# Patient Record
Sex: Female | Born: 1980
Health system: Southern US, Community
[De-identification: ages and names within clinical notes are randomized; demographics above are authoritative.]

## PROBLEM LIST (undated history)

## (undated) DIAGNOSIS — K589 Irritable bowel syndrome without diarrhea: Secondary | ICD-10-CM

## (undated) HISTORY — PX: OTHER SURGICAL HISTORY: SHX169

## (undated) HISTORY — PX: ANKLE SURGERY: SHX546

---
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
## 2002-01-27 ENCOUNTER — Other Ambulatory Visit: Admission: RE | Admit: 2002-01-27 | Discharge: 2002-01-27 | Payer: Self-pay | Admitting: Obstetrics and Gynecology

## 2002-04-24 ENCOUNTER — Other Ambulatory Visit: Admission: RE | Admit: 2002-04-24 | Discharge: 2002-04-24 | Payer: Self-pay | Admitting: Obstetrics and Gynecology

## 2002-07-22 ENCOUNTER — Encounter: Payer: Self-pay | Admitting: Obstetrics and Gynecology

## 2002-07-22 ENCOUNTER — Inpatient Hospital Stay (HOSPITAL_COMMUNITY): Admission: AD | Admit: 2002-07-22 | Discharge: 2002-07-22 | Payer: Self-pay | Admitting: Obstetrics and Gynecology

## 2002-08-11 ENCOUNTER — Other Ambulatory Visit: Admission: RE | Admit: 2002-08-11 | Discharge: 2002-08-11 | Payer: Self-pay | Admitting: Obstetrics and Gynecology

## 2002-09-29 ENCOUNTER — Inpatient Hospital Stay (HOSPITAL_COMMUNITY): Admission: AD | Admit: 2002-09-29 | Discharge: 2002-09-29 | Payer: Self-pay | Admitting: Obstetrics and Gynecology

## 2002-10-04 ENCOUNTER — Inpatient Hospital Stay (HOSPITAL_COMMUNITY): Admission: AD | Admit: 2002-10-04 | Discharge: 2002-10-06 | Payer: Self-pay | Admitting: Obstetrics and Gynecology

## 2002-11-11 ENCOUNTER — Other Ambulatory Visit: Admission: RE | Admit: 2002-11-11 | Discharge: 2002-11-11 | Payer: Self-pay | Admitting: Obstetrics and Gynecology

## 2003-02-09 ENCOUNTER — Ambulatory Visit: Admission: RE | Admit: 2003-02-09 | Discharge: 2003-02-09 | Payer: Self-pay | Admitting: Gynecology

## 2003-05-05 ENCOUNTER — Ambulatory Visit (HOSPITAL_COMMUNITY): Admission: RE | Admit: 2003-05-05 | Discharge: 2003-05-05 | Payer: Self-pay | Admitting: Gynecology

## 2003-05-15 ENCOUNTER — Inpatient Hospital Stay (HOSPITAL_COMMUNITY): Admission: AD | Admit: 2003-05-15 | Discharge: 2003-05-15 | Payer: Self-pay | Admitting: Obstetrics and Gynecology

## 2003-06-02 ENCOUNTER — Ambulatory Visit: Admission: RE | Admit: 2003-06-02 | Discharge: 2003-06-02 | Payer: Self-pay | Admitting: Gynecology

## 2003-08-23 ENCOUNTER — Other Ambulatory Visit: Admission: RE | Admit: 2003-08-23 | Discharge: 2003-08-23 | Payer: Self-pay | Admitting: Obstetrics and Gynecology

## 2004-01-14 ENCOUNTER — Ambulatory Visit (HOSPITAL_COMMUNITY): Admission: RE | Admit: 2004-01-14 | Discharge: 2004-01-14 | Payer: Self-pay | Admitting: Obstetrics and Gynecology

## 2004-02-25 ENCOUNTER — Inpatient Hospital Stay (HOSPITAL_COMMUNITY): Admission: AD | Admit: 2004-02-25 | Discharge: 2004-02-27 | Payer: Self-pay | Admitting: Obstetrics and Gynecology

## 2004-02-28 ENCOUNTER — Encounter: Admission: RE | Admit: 2004-02-28 | Discharge: 2004-03-29 | Payer: Self-pay | Admitting: Obstetrics and Gynecology

## 2004-04-04 ENCOUNTER — Other Ambulatory Visit: Admission: RE | Admit: 2004-04-04 | Discharge: 2004-04-04 | Payer: Self-pay | Admitting: Obstetrics and Gynecology

## 2010-01-21 ENCOUNTER — Ambulatory Visit: Payer: Self-pay | Admitting: Family Medicine

## 2010-01-21 DIAGNOSIS — J40 Bronchitis, not specified as acute or chronic: Secondary | ICD-10-CM | POA: Insufficient documentation

## 2011-01-02 NOTE — Assessment & Plan Note (Signed)
Summary: sinus pressure, cough, SOB, chest congestion x 6dys   rm 2   Vital Signs:  Patient Profile:   30 Years Old Female CC:      Cold & URI symptoms Height:     62 inches Weight:      157 pounds O2 Sat:      99 % O2 treatment:    Room Air Temp:     99.0 degrees F oral Pulse rate:   96 / minute Pulse rhythm:   regular Resp:     16 per minute BP sitting:   126 / 80  (right arm) Cuff size:   regular  Vitals Entered By: Areta Haber CMA (January 21, 2010 1:54 PM)                  Current Allergies: No known allergies History of Present Illness History from: patient Chief Complaint: Cold & URI symptoms History of Present Illness: Otherwise healthy female. Comes c/o cough congestion and fever for over 1 week. Had the flu last week. Took tamiflu. Still persistent cough with yellow sputum. Also nasal congestion. Symptoms worse at night thinks she has heared wheezing. No chest pain or diffculty breathing but persistant cough at night. Has not checked her temperature but has chill at night time. Eating solids and drinking fluids but feels tired. Taking musinex.   REVIEW OF SYSTEMS Constitutional Symptoms       Complains of fever.     Denies chills, night sweats, weight loss, weight gain, and fatigue.  Eyes       Denies change in vision, eye pain, eye discharge, glasses, contact lenses, and eye surgery. Ear/Nose/Throat/Mouth       Complains of frequent runny nose, sinus problems, and hoarseness.      Denies hearing loss/aids, change in hearing, ear pain, ear discharge, dizziness, frequent nose bleeds, sore throat, and tooth pain or bleeding.      Comments: x 6dys Respiratory       Complains of dry cough and shortness of breath.      Denies productive cough, wheezing, asthma, bronchitis, and emphysema/COPD.  Cardiovascular       Denies murmurs, chest pain, and tires easily with exhertion.      Comments: chest congestion   Gastrointestinal       Denies stomach pain,  nausea/vomiting, diarrhea, constipation, blood in bowel movements, and indigestion. Genitourniary       Denies painful urination, kidney stones, and loss of urinary control. Neurological       Denies paralysis, seizures, and fainting/blackouts. Musculoskeletal       Complains of muscle pain and joint pain.      Denies joint stiffness, decreased range of motion, redness, swelling, muscle weakness, and gout.  Skin       Denies bruising, unusual mles/lumps or sores, and hair/skin or nail changes.  Psych       Denies mood changes, temper/anger issues, anxiety/stress, speech problems, depression, and sleep problems. Physical Exam General appearance: well developed, well nourished, no acute distress Eyes: conjunctivae and lids normal, no eye exudate. Ears: Clear fluid behind normal looking TM's bilat. Nasal: significant nasal congestion swolen turbinates and yellow rhinorhea. Oral/Pharynx: tongue normal, posterior pharynx without erythema or exudate Neck: neck supple,  trachea midline, no masses Chest/Lungs: Good effort. normal breast sound bilateral but sporadic ronchi bilaterally. No crackles or wheezing. Heart: regular rate and  rhythm, no murmur Abdomen: soft, non-tender without obvious organomegaly Skin: no obvious rashes. Assessment New Problems: BRONCHITIS (ICD-490)  Patient declined X-Ray as she needed to leave. I explained that she has bronchitis and although I dont have clinical findings suggesting pneumonia given the time of evolution of her symptoms there a change or early pneumonia. I decided to treat with 1g of Rocephin IM x1. And prescribed azithromycin for 5 days and albuterol inhaled. Also discussed symptomatic treatment. Reccomended follow up in 2 days if no improvement or earlier if worsening symptoms.  Plan New Medications/Changes: ROBAFEN AC 100-10 MG/5ML SYRP (GUAIFENESIN-CODEINE) 5 mls every 6 hours as needed for cough  #170mls x 0, 01/21/2010, Korvin Valentine Moreno-Coll   MD PROVENTIL HFA 108 (90 BASE) MCG/ACT AERS (ALBUTEROL SULFATE) 1-2 puff qid as needed for coughing spells or wheezing.  #1 x 0, 01/21/2010, Mar Zettler Moreno-Coll  MD AZITHROMYCIN 250 MG TABS (AZITHROMYCIN) 2 tabs by mouth today then 1 tab daily for 4 days.  #6 x 0, 01/21/2010, Larwence Tu Moreno-Coll  MD  New Orders: Rocephin  250mg  [Z3086] New Patient Level III [99203]  The patient and/or caregiver has been counseled thoroughly with regard to medications prescribed including dosage, schedule, interactions, rationale for use, and possible side effects and they verbalize understanding.  Diagnoses and expected course of recovery discussed and will return if not improved as expected or if the condition worsens. Patient and/or caregiver verbalized understanding.  Prescriptions: ROBAFEN AC 100-10 MG/5ML SYRP (GUAIFENESIN-CODEINE) 5 mls every 6 hours as needed for cough  #157mls x 0   Entered and Authorized by:   Sharin Grave  MD   Signed by:   Sharin Grave  MD on 01/21/2010   Method used:   Print then Give to Patient   RxID:   [id number] PROVENTIL HFA 108 (90 BASE) MCG/ACT AERS (ALBUTEROL SULFATE) 1-2 puff qid as needed for coughing spells or wheezing.  #1 x 0   Entered and Authorized by:   Sharin Grave  MD   Signed by:   Sharin Grave  MD on 01/21/2010   Method used:   Print then Give to Patient   RxID:   [id number] AZITHROMYCIN 250 MG TABS (AZITHROMYCIN) 2 tabs by mouth today then 1 tab daily for 4 days.  #6 x 0   Entered and Authorized by:   Sharin Grave  MD   Signed by:   Sharin Grave  MD on 01/21/2010   Method used:   Print then Give to Patient   RxID:   [id number]   Patient Instructions: 1)  Keep hydrated taking plenty of fluids. 2)  Use nasal saline spray at lest 3 times a day. 3)  Use decongestant and medication for cough as needed. 4)  Can also take clritin-D twise a day. 5)  Take tylenol every 6 hours or or motrin every 8 hours for fever or  pain. 6)  Start taking the prescribed antibiotic today. 7)  Your lungs sound well today and your oxigen exchange is also good. I think you have bronchitis but there is there is also the possibility of early stages of pneumonia. This is why I want you to be rechecked by your primary doctor or here in 2 days. Return sooner if increasing cough, fever, or new symptoms ( shortness of breath, chest pain) despite following treatment.

## 2011-04-20 NOTE — Consult Note (Signed)
Lauren Wade, Lauren Wade                           ACCOUNT NO.:  [id number]   MEDICAL RECORD NO.:  [id number]                   PATIENT TYPE:  OUT   LOCATION:  GYN                                  FACILITY:  [facility]   PHYSICIAN:  De Blanch, M.D.         DATE OF BIRTH:  [birth year]   DATE OF CONSULTATION:  DATE OF DISCHARGE:                                   CONSULTATION   HISTORY OF PRESENT ILLNESS:  The patient is a 30 year old white married  female seen in consultation at the request of Dr. Harold Hedge regarding  management of severe dysplasia of the cervix and vulva. The patient  apparently had an abnormal PAP smear in February of 2003 and was found at  that time to be pregnant. Colposcopy was performed. The patient also had  some lesions in her vulva during pregnancy which were biopsies that showed  vulvar intraepithelial neoplasia, grade III. Now postpartum, the patient has  undergone repeat PAP smear showing mild dysplasia but colposcopically  directed biopsy showed severe dysplasia and the cervical curettage is  negative. In addition, the patient has persistent vulvar lesions and had  multiple biopsies, all showing carcinoma in situ as well. The patient denies  any immunosuppression and is not a smoker. She is on oral contraceptives but  otherwise, is healthy. She has no past surgical history.   PHYSICAL EXAMINATION:  VITAL SIGNS: Weight 165 pounds. Height 5'2. Blood  pressure 118/70, pulse 92, respiratory rate 18.  GENERAL: A healthy young woman in no acute distress.  HEENT: Negative.  NECK: Supple without thyromegaly.  ABDOMEN: Soft, nontender. No masses, organomegaly, ascites, or hernias  noted.  PELVIC: EG BUS showed the number of raised flat warts. These are  approximately 5-6 mm in diameter on both sides of the vulva. There is some  hyperpigmented lesions as well. The vagina is normal. Cervix appears parous.  Uterus is anterior and normal shape, size, and  consistency.   PROCEDURE:  Colposcopic examination of the cervic, vulva, and vagina are  performed. The cervix shows a fairly large area of white epithelium  circumferentially. The squamocolumnar junction can be seen and there is no  involvement in the endocervical canal. The vagina appears normal. There are  multiple lesions on the vulva, some of which are hyperpigmented. Others are  hypopigmented.   IMPRESSION:  Severe dysplasia of the vulva and cervix.   PLAN:  Treatment options were discussed with the patient and her mother.  After exploring the options, it seems that the patient would be best treated  by using CO2 laser to vaporize the cervix as well as her vulvar lesions. I  indicated to the patient and her mother that there is approximately an 80%  success rate with this procedure but that prolonged follow-up with serial  PAP smears and colposcopies would be recommended. I contacted Dr. Harold Hedge to  discuss these recommendations and he would request  that we request with  scheduling laser vaporization. The patient is planning a trip to her home in  [place], [place] in the near future and we will therefore schedule her to  undergo outpatient laser vaporization on March 10, 2003.                                               De Blanch, M.D.    DC/MEDQ  D:  02/09/2003  T:  02/10/2003  Job:  [id number]   cc:   Guy Sandifer. Arleta Creek, M.D.  [address]  [place]  [place] [address]  Fax: [phone]   Telford Nab, R.N.

## 2011-04-20 NOTE — Op Note (Signed)
NAMEAYAHNA, SOLAZZO                           ACCOUNT NO.:  [id number]   MEDICAL RECORD NO.:  [id number]                   PATIENT TYPE:  AMB   LOCATION:  DAY                                  FACILITY:  [facility]    PHYSICIAN:  De Blanch, M.D.         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   PROCEDURES:  1. CO2 laser ablation of the cervix.  2. CO2 laser ablation of the vulva (extensive).   SURGEON:  De Blanch, M.D.   ANESTHESIA:  General LMA.   SURGICAL FINDINGS:  Colposcopic examination of the cervix was performed,  revealing white epithelium, which was relatively extensive over the  transformation zone.  The squamocolumnar junction was easily seen and the  colposcopy was deemed to be adequate.  The patient had multiple vulvar  lesions, which were raised and hyperpigmented, over nearly the entire vulva.  There was no involvement of the anus or vagina.   DESCRIPTION OF PROCEDURE:  The patient was brought to the operating room and  after satisfactory attainment of general anesthesia was placed in the  lithotomy position in candy cane stirrups.  A speculum with smoke evacuator  was positioned and the cervix exposed.  The cervix was washed with acetic  acid and a colposcopy was performed with the above-noted findings.  Using  the CO2 laser at 20 watts of power, the entire cervical transformation zone  was ablated to a depth of approximately 6 mm.  Additional ablation was  performed of the endocervical canal.  Some bleeding points were cauterized  using the Bovie electrocautery.   The speculum was removed and the vulva exposed.  Acetic acid was used on the  vulva to heighten recognition of the abnormal areas.  Using the CO2  laser  again at 20 watts of power but in a defocused mode, the vulvar lesions were  ablated to the depth of the second surgical plane.  The char was wiped away  using acetic acid, confirming the proper depth of ablation.   The vulva was washed.  The cervix was reinspected with a speculum and found  to be hemostatic.  Silvadene cream was applied to the vulva.  The patient  was awakened from anesthesia and taken to the recovery room in satisfactory  condition.  Sponge, needle, and instrument counts correct x2.                                                De Blanch, M.D.    DC/MEDQ  D:  05/05/2003  T:  05/05/2003  Job:  [id number]   cc:   Guy Sandifer. Arleta Creek, M.D.  [address]  [place]  [place] [address]  Fax: [phone]   Telford Nab, R.N.

## 2011-04-20 NOTE — Consult Note (Signed)
   NAME:  Lauren Wade, Lauren Wade                         ACCOUNT NO.:  [id number]   MEDICAL RECORD NO.:  [id number]                   PATIENT TYPE:  OUT   LOCATION:  GYN                                  FACILITY:  [facility]   PHYSICIAN:  De Blanch, M.D.         DATE OF BIRTH:  1981-02-05   DATE OF CONSULTATION:  06/02/2003  DATE OF DISCHARGE:                                   CONSULTATION   HISTORY OF PRESENT ILLNESS:  This 30 year old returns for postoperative  checkup, having had CO2 laser ablation of the cervix and vulva on May 05, 2003.  The vulvar ablation for carcinoma in situ was quite extensive.  The  patient actually had a very smooth recovery and at the present time feels  that she is nearly completely epithelialized and is having minimal  discomfort.  She is no longer using sitz baths or Silvadene cream.  She has  had one menstrual period which is currently ongoing.  She denies any other  vaginal bleeding or discharge.  She has no fever or chills, and her  functional status is excellent.  She seems to be quite happy with her  outcome.   PHYSICAL EXAMINATION:  VITAL SIGNS:  Weight 152 pounds.  Blood pressure  126/76.  GENERAL:  Healthy young woman in no acute distress.  PELVIC:  EGBUS shows the lasered areas are healing nicely with good  epithelialization.  There is still a small amount of granulation tissue on  the right upper vulva.  The remainder is basically completely  epithelialized.  The vagina has menstrual blood.  The cervix is status post  laser vaporization.  There is a bit of an ectropion which appears normal.   IMPRESSION:  Excellent recovery following laser vaporization for extensive  carcinoma in situ of the cervix and vulva.   I would like the patient to return in four weeks for another checkup, and at  that juncture as long as things are going well we will return her to the  care of her primary gynecologist, Dr. Henderson Cloud.                      De Blanch, M.D.    DC/MEDQ  D:  06/02/2003  T:  06/02/2003  Job:  [id number]   cc:   Guy Sandifer. Arleta Creek, M.D.  [address]  [place]  [place] [address]  Fax: [phone]   Telford Nab, R.N.  [phone] [address]  [place], [place] [address]

## 2012-12-19 ENCOUNTER — Emergency Department (INDEPENDENT_AMBULATORY_CARE_PROVIDER_SITE_OTHER): Payer: BC Managed Care – PPO

## 2012-12-19 ENCOUNTER — Encounter: Payer: Self-pay | Admitting: *Deleted

## 2012-12-19 ENCOUNTER — Emergency Department
Admission: EM | Admit: 2012-12-19 | Discharge: 2012-12-19 | Disposition: A | Discharge status: Home / Self Care | Payer: BC Managed Care – PPO | Source: Home / Self Care | Attending: Family Medicine | Admitting: Family Medicine

## 2012-12-19 ENCOUNTER — Other Ambulatory Visit: Payer: BC Managed Care – PPO

## 2012-12-19 DIAGNOSIS — K5900 Constipation, unspecified: Secondary | ICD-10-CM

## 2012-12-19 DIAGNOSIS — IMO0001 Reserved for inherently not codable concepts without codable children: Secondary | ICD-10-CM

## 2012-12-19 DIAGNOSIS — R1011 Right upper quadrant pain: Secondary | ICD-10-CM

## 2012-12-19 DIAGNOSIS — K589 Irritable bowel syndrome without diarrhea: Secondary | ICD-10-CM

## 2012-12-19 DIAGNOSIS — R1012 Left upper quadrant pain: Secondary | ICD-10-CM

## 2012-12-19 DIAGNOSIS — R109 Unspecified abdominal pain: Secondary | ICD-10-CM

## 2012-12-19 HISTORY — DX: Irritable bowel syndrome, unspecified: K58.9

## 2012-12-19 LAB — POCT CBC W AUTO DIFF (K'VILLE URGENT CARE)

## 2012-12-19 MED ORDER — PANTOPRAZOLE SODIUM 40 MG PO TBEC: 40.0000 mg | DELAYED_RELEASE_TABLET | Freq: Every day | ORAL | Status: DC

## 2012-12-19 NOTE — ED Notes (Signed)
Pt c/o RUQ abd pain x 1 1/2 wk, which has mostly resolved. She also c/o LUQ abd pain now with some nausea and increased burping. Denies fever. Denies vomiting and diarrhea.

## 2012-12-19 NOTE — ED Provider Notes (Signed)
History     CSN: [id number]  Arrival date & time 12/19/12  1059   First MD Initiated Contact with Patient 12/19/12 1108      Chief Complaint  Patient presents with  . Abdominal Pain    RUQ and LUQ abd pain   HPI Pt presents today with chief complaint of abdominal discomfort x 1 1/2 weeks.  Pt reports going to [place] 2-3 weeks ago.  Pt states that 1-2 days after coming home, she noticed intermittent RUQ pain.  Pain seems to be worse after eating.  Pt also had mild indigestion sxs with RUQ pain including belching and increased gas.  Pt with a baseline hx/o IBS-constipation predominant. Pt states that sxs have been fairly stable. No diarrhea.  Pt also reports prior hx/o reflux sxs that has not been formally treated.  Pt does report eating high amounts of fatty food while in [place] including buffalo wings and pizza. Discomfort has radiated to LUQ over past 3-4 days.  Discomfort is intermittent. Also sometimes associated with eating  No dysuria, diarrhea, nausea, vomiting.  No fevers, chills.  Pt describes sxs as more of a discomfort that is mild 5/10 in terms of intensity. No radiation.    Past Medical History  Diagnosis Date  . IBS (irritable bowel syndrome)     Past Surgical History  Procedure Date  . Removal of cervical ca     Family History  Problem Relation Age of Onset  . Hyperlipidemia Father     History  Substance Use Topics  . Smoking status: Former Games developer  . Smokeless tobacco: Not on file  . Alcohol Use: Yes    OB History    Grav Para Term Preterm Abortions TAB SAB Ect Mult Living                  Review of Systems  All other systems reviewed and are negative.    Allergies  Review of patient's allergies indicates no known allergies.  Home Medications  No current outpatient prescriptions on file.  BP 113/78  Pulse 70  Temp 98.3 F (36.8 C) (Oral)  Resp 16  Ht 5\' 2"  (1.575 m)  Wt 148 lb (67.132 kg)  BMI 27.07 kg/m2  SpO2 100%   LMP 12/15/2012  Physical Exam  Constitutional: She appears well-developed and well-nourished.  HENT:  Head: Normocephalic and atraumatic.  Right Ear: External ear normal.  Left Ear: External ear normal.  Eyes: Conjunctivae normal are normal. Pupils are equal, round, and reactive to light.  Neck: Normal range of motion. Neck supple.  Cardiovascular: Normal rate, regular rhythm and normal heart sounds.   Pulmonary/Chest: Effort normal and breath sounds normal.  Abdominal: Soft.       + mild RUQ TTP Mild LUQ TTP    ED Course  Procedures (including critical care time)   Labs Reviewed  CBC WITH DIFFERENTIAL  COMPREHENSIVE METABOLIC PANEL  LIPASE   Dg Abd 1 View  12/19/2012  *RADIOLOGY REPORT*  Clinical Data: Right-sided abdominal pain, now moving to the left side  ABDOMEN - 1 VIEW  Comparison: None.  Findings: A supine film of the abdomen shows a moderate amount of feces throughout the entire colon.  No bowel obstruction is seen. Some small bowel gas is noted in the left upper quadrant without distention.  No opaque calculi are noted.  The bones appear normal.  IMPRESSION:  Moderate amount of feces throughout the colon.  No bowel obstruction.   Original Report Authenticated  By: Dwyane Dee, M.D.    US Abdomen Complete  12/19/2012  *RADIOLOGY REPORT*  Clinical Data:  Abdominal pain.  COMPLETE ABDOMINAL ULTRASOUND  Comparison:  01/14/2004  Findings:  Gallbladder:  No gallstones, gallbladder wall thickening, or pericholecystic fluid.  Common bile duct:  Measures 0.2 cm.  Liver:  No focal lesion identified.  Within normal limits in parenchymal echogenicity.  IVC:  Appears normal.  Pancreas:  No focal abnormality seen.  Spleen:  Measures 5.9 cm.  Right Kidney:  Normal appearance of the right kidney.  Right kidney measures 9.3 cm length without hydronephrosis.  There is a 0.8 cm hypoechoic structure in the right kidney lower pole that is suggestive for a cyst.  Left Kidney:  Left kidney is poorly  visualized.  There may be mild fullness in the left renal pelvis which could represent an external pelvis and appears similar to the prior examination.  Abdominal aorta:  No aneurysm identified.  IMPRESSION: No acute findings.  Limited evaluation of the left kidney.  There may be fullness in left renal pelvis but this appears similar to the exam from 2005.   Original Report Authenticated By: Richarda Overlie, M.D.      1. Abdominal discomfort   2. Constipation   3. Reflux   4. IBS (irritable bowel syndrome)       MDM  Ultrasound negative for any bilary disease. There may be an element of biliary colic.  Noted moderate stool burden on KUB.  Will start on miralax.  Protonix for reflux coverage.  CBC reassuring.  CMET and lipase pending.  Discussed general diet and GI red flags.  Follow up as needed.     The patient and/or caregiver has been counseled thoroughly with regard to treatment plan and/or medications prescribed including dosage, schedule, interactions, rationale for use, and possible side effects and they verbalize understanding. Diagnoses and expected course of recovery discussed and will return if not improved as expected or if the condition worsens. Patient and/or caregiver verbalized understanding.             Doree Albee, MD 12/19/12 1309

## 2012-12-20 LAB — CBC WITH DIFFERENTIAL/PLATELET

## 2012-12-21 ENCOUNTER — Telehealth: Payer: Self-pay | Admitting: Emergency Medicine

## 2012-12-21 LAB — COMPREHENSIVE METABOLIC PANEL
ALT: 8 U/L (ref 0–35)
AST: 16 U/L (ref 0–37)
Alkaline Phosphatase: 53 U/L (ref 39–117)
BUN: 11 mg/dL (ref 6–23)
Creat: 0.9 mg/dL (ref 0.50–1.10)

## 2012-12-22 ENCOUNTER — Telehealth: Payer: Self-pay | Admitting: Emergency Medicine

## 2014-12-24 ENCOUNTER — Ambulatory Visit: Payer: BC Managed Care – PPO | Admitting: Physician Assistant

## 2015-01-07 ENCOUNTER — Encounter: Payer: Self-pay | Admitting: Physician Assistant

## 2015-01-07 ENCOUNTER — Ambulatory Visit (INDEPENDENT_AMBULATORY_CARE_PROVIDER_SITE_OTHER): Payer: BC Managed Care – PPO | Admitting: Physician Assistant

## 2015-01-07 VITALS — BP 122/73 | HR 86 | Ht 62.0 in | Wt 185.0 lb

## 2015-01-07 DIAGNOSIS — E6609 Other obesity due to excess calories: Secondary | ICD-10-CM | POA: Insufficient documentation

## 2015-01-07 DIAGNOSIS — F411 Generalized anxiety disorder: Secondary | ICD-10-CM | POA: Diagnosis not present

## 2015-01-07 DIAGNOSIS — M25511 Pain in right shoulder: Secondary | ICD-10-CM | POA: Diagnosis not present

## 2015-01-07 DIAGNOSIS — E669 Obesity, unspecified: Secondary | ICD-10-CM

## 2015-01-07 DIAGNOSIS — R635 Abnormal weight gain: Secondary | ICD-10-CM

## 2015-01-07 DIAGNOSIS — N879 Dysplasia of cervix uteri, unspecified: Secondary | ICD-10-CM | POA: Diagnosis not present

## 2015-01-07 DIAGNOSIS — Z6836 Body mass index (BMI) 36.0-36.9, adult: Secondary | ICD-10-CM

## 2015-01-07 MED ORDER — MELOXICAM 15 MG PO TABS: 15.0000 mg | ORAL_TABLET | Freq: Every day | ORAL | Status: DC

## 2015-01-07 MED ORDER — PHENTERMINE HCL 37.5 MG PO TABS: 37.5000 mg | ORAL_TABLET | Freq: Every day | ORAL | Status: DC

## 2015-01-07 NOTE — Patient Instructions (Addendum)
[place]/Viibryd- weight neutral anxiety.   Impingement Syndrome, Rotator Cuff, Bursitis with Rehab Impingement syndrome is a condition that involves inflammation of the tendons of the rotator cuff and the subacromial bursa, that causes pain in the shoulder. The rotator cuff consists of four tendons and muscles that control much of the shoulder and upper arm function. The subacromial bursa is a fluid filled sac that helps reduce friction between the rotator cuff and one of the bones of the shoulder (acromion). Impingement syndrome is usually an overuse injury that causes swelling of the bursa (bursitis), swelling of the tendon (tendonitis), and/or a tear of the tendon (strain). Strains are classified into three categories. Grade 1 strains cause pain, but the tendon is not lengthened. Grade 2 strains include a lengthened ligament, due to the ligament being stretched or partially ruptured. With grade 2 strains there is still function, although the function may be decreased. Grade 3 strains include a complete tear of the tendon or muscle, and function is usually impaired. SYMPTOMS   Pain around the shoulder, often at the outer portion of the upper arm.  Pain that gets worse with shoulder function, especially when reaching overhead or lifting.  Sometimes, aching when not using the arm.  Pain that wakes you up at night.  Sometimes, tenderness, swelling, warmth, or redness over the affected area.  Loss of strength.  Limited motion of the shoulder, especially reaching behind the back (to the back pocket or to unhook bra) or across your body.  Crackling sound (crepitation) when moving the arm.  Biceps tendon pain and inflammation (in the front of the shoulder). Worse when bending the elbow or lifting. CAUSES  Impingement syndrome is often an overuse injury, in which chronic (repetitive) motions cause the tendons or bursa to become inflamed. A strain occurs when a force is paced on the tendon or  muscle that is greater than it can withstand. Common mechanisms of injury include: Stress from sudden increase in duration, frequency, or intensity of training.  Direct hit (trauma) to the shoulder.  Aging, erosion of the tendon with normal use.  Bony bump on shoulder (acromial spur). RISK INCREASES WITH:  Contact sports (football, wrestling, boxing).  Throwing sports (baseball, tennis, volleyball).  Weightlifting and bodybuilding.  Heavy labor.  Previous injury to the rotator cuff, including impingement.  Poor shoulder strength and flexibility.  Failure to warm up properly before activity.  Inadequate protective equipment.  Old age.  Bony bump on shoulder (acromial spur). PREVENTION   Warm up and stretch properly before activity.  Allow for adequate recovery between workouts.  Maintain physical fitness:  Strength, flexibility, and endurance.  Cardiovascular fitness.  Learn and use proper exercise technique. PROGNOSIS  If treated properly, impingement syndrome usually goes away within 6 weeks. Sometimes surgery is required.  RELATED COMPLICATIONS   Longer healing time if not properly treated, or if not given enough time to heal.  Recurring symptoms, that result in a chronic condition.  Shoulder stiffness, frozen shoulder, or loss of motion.  Rotator cuff tendon tear.  Recurring symptoms, especially if activity is resumed too soon, with overuse, with a direct blow, or when using poor technique. TREATMENT  Treatment first involves the use of ice and medicine, to reduce pain and inflammation. The use of strengthening and stretching exercises may help reduce pain with activity. These exercises may be performed at home or with a therapist. If non-surgical treatment is unsuccessful after more than 6 months, surgery may be advised. After surgery and rehabilitation, activity  is usually possible in 3 months.  MEDICATION  If pain medicine is needed, nonsteroidal  anti-inflammatory medicines (aspirin and ibuprofen), or other minor pain relievers (acetaminophen), are often advised.  Do not take pain medicine for 7 days before surgery.  Prescription pain relievers may be given, if your caregiver thinks they are needed. Use only as directed and only as much as you need.  Corticosteroid injections may be given by your caregiver. These injections should be reserved for the most serious cases, because they may only be given a certain number of times. HEAT AND COLD  Cold treatment (icing) should be applied for 10 to 15 minutes every 2 to 3 hours for inflammation and pain, and immediately after activity that aggravates your symptoms. Use ice packs or an ice massage.  Heat treatment may be used before performing stretching and strengthening activities prescribed by your caregiver, physical therapist, or athletic trainer. Use a heat pack or a warm water soak. SEEK MEDICAL CARE IF:   Symptoms get worse or do not improve in 4 to 6 weeks, despite treatment.  New, unexplained symptoms develop. (Drugs used in treatment may produce side effects.) EXERCISES  RANGE OF MOTION (ROM) AND STRETCHING EXERCISES - Impingement Syndrome (Rotator Cuff  Tendinitis, Bursitis) These exercises may help you when beginning to rehabilitate your injury. Your symptoms may go away with or without further involvement from your physician, physical therapist or athletic trainer. While completing these exercises, remember:   Restoring tissue flexibility helps normal motion to return to the joints. This allows healthier, less painful movement and activity.  An effective stretch should be held for at least 30 seconds.  A stretch should never be painful. You should only feel a gentle lengthening or release in the stretched tissue. STRETCH - Flexion, Standing  Stand with good posture. With an underhand grip on your right / left hand, and an overhand grip on the opposite hand, grasp a  broomstick or cane so that your hands are a little more than shoulder width apart.  Keeping your right / left elbow straight and shoulder muscles relaxed, push the stick with your opposite hand, to raise your right / left arm in front of your body and then overhead. Raise your arm until you feel a stretch in your right / left shoulder, but before you have increased shoulder pain.  Try to avoid shrugging your right / left shoulder as your arm rises, by keeping your shoulder blade tucked down and toward your mid-back spine. Hold for __________ seconds.  Slowly return to the starting position. Repeat __________ times. Complete this exercise __________ times per day. STRETCH - Abduction, Supine  Lie on your back. With an underhand grip on your right / left hand and an overhand grip on the opposite hand, grasp a broomstick or cane so that your hands are a little more than shoulder width apart.  Keeping your right / left elbow straight and your shoulder muscles relaxed, push the stick with your opposite hand, to raise your right / left arm out to the side of your body and then overhead. Raise your arm until you feel a stretch in your right / left shoulder, but before you have increased shoulder pain.  Try to avoid shrugging your right / left shoulder as your arm rises, by keeping your shoulder blade tucked down and toward your mid-back spine. Hold for __________ seconds.  Slowly return to the starting position. Repeat __________ times. Complete this exercise __________ times per day. ROM -  Flexion, Active-Assisted  Lie on your back. You may bend your knees for comfort.  Grasp a broomstick or cane so your hands are about shoulder width apart. Your right / left hand should grip the end of the stick, so that your hand is positioned "thumbs-up," as if you were about to shake hands.  Using your healthy arm to lead, raise your right / left arm overhead, until you feel a gentle stretch in your shoulder.  Hold for __________ seconds.  Use the stick to assist in returning your right / left arm to its starting position. Repeat __________ times. Complete this exercise __________ times per day.  ROM - Internal Rotation, Supine   Lie on your back on a firm surface. Place your right / left elbow about 60 degrees away from your side. Elevate your elbow with a folded towel, so that the elbow and shoulder are the same height.  Using a broomstick or cane and your strong arm, pull your right / left hand toward your body until you feel a gentle stretch, but no increase in your shoulder pain. Keep your shoulder and elbow in place throughout the exercise.  Hold for __________ seconds. Slowly return to the starting position. Repeat __________ times. Complete this exercise __________ times per day. STRETCH - Internal Rotation  Place your right / left hand behind your back, palm up.  Throw a towel or belt over your opposite shoulder. Grasp the towel with your right / left hand.  While keeping an upright posture, gently pull up on the towel, until you feel a stretch in the front of your right / left shoulder.  Avoid shrugging your right / left shoulder as your arm rises, by keeping your shoulder blade tucked down and toward your mid-back spine.  Hold for __________ seconds. Release the stretch, by lowering your healthy hand. Repeat __________ times. Complete this exercise __________ times per day. ROM - Internal Rotation   Using an underhand grip, grasp a stick behind your back with both hands.  While standing upright with good posture, slide the stick up your back until you feel a mild stretch in the front of your shoulder.  Hold for __________ seconds. Slowly return to your starting position. Repeat __________ times. Complete this exercise __________ times per day.  STRETCH - Posterior Shoulder Capsule   Stand or sit with good posture. Grasp your right / left elbow and draw it across your chest,  keeping it at the same height as your shoulder.  Pull your elbow, so your upper arm comes in closer to your chest. Pull until you feel a gentle stretch in the back of your shoulder.  Hold for __________ seconds. Repeat __________ times. Complete this exercise __________ times per day. STRENGTHENING EXERCISES - Impingement Syndrome (Rotator Cuff Tendinitis, Bursitis) These exercises may help you when beginning to rehabilitate your injury. They may resolve your symptoms with or without further involvement from your physician, physical therapist or athletic trainer. While completing these exercises, remember:  Muscles can gain both the endurance and the strength needed for everyday activities through controlled exercises.  Complete these exercises as instructed by your physician, physical therapist or athletic trainer. Increase the resistance and repetitions only as guided.  You may experience muscle soreness or fatigue, but the pain or discomfort you are trying to eliminate should never worsen during these exercises. If this pain does get worse, stop and make sure you are following the directions exactly. If the pain is still present after adjustments,  discontinue the exercise until you can discuss the trouble with your clinician.  During your recovery, avoid activity or exercises which involve actions that place your injured hand or elbow above your head or behind your back or head. These positions stress the tissues which you are trying to heal. STRENGTH - Scapular Depression and Adduction   With good posture, sit on a firm chair. Support your arms in front of you, with pillows, arm rests, or on a table top. Have your elbows in line with the sides of your body.  Gently draw your shoulder blades down and toward your mid-back spine. Gradually increase the tension, without tensing the muscles along the top of your shoulders and the back of your neck.  Hold for __________ seconds. Slowly release  the tension and relax your muscles completely before starting the next repetition.  After you have practiced this exercise, remove the arm support and complete the exercise in standing as well as sitting position. Repeat __________ times. Complete this exercise __________ times per day.  STRENGTH - Shoulder Abductors, Isometric  With good posture, stand or sit about 4-6 inches from a wall, with your right / left side facing the wall.  Bend your right / left elbow. Gently press your right / left elbow into the wall. Increase the pressure gradually, until you are pressing as hard as you can, without shrugging your shoulder or increasing any shoulder discomfort.  Hold for __________ seconds.  Release the tension slowly. Relax your shoulder muscles completely before you begin the next repetition. Repeat __________ times. Complete this exercise __________ times per day.  STRENGTH - External Rotators, Isometric  Keep your right / left elbow at your side and bend it 90 degrees.  Step into a door frame so that the outside of your right / left wrist can press against the door frame without your upper arm leaving your side.  Gently press your right / left wrist into the door frame, as if you were trying to swing the back of your hand away from your stomach. Gradually increase the tension, until you are pressing as hard as you can, without shrugging your shoulder or increasing any shoulder discomfort.  Hold for __________ seconds.  Release the tension slowly. Relax your shoulder muscles completely before you begin the next repetition. Repeat __________ times. Complete this exercise __________ times per day.  STRENGTH - Supraspinatus   Stand or sit with good posture. Grasp a __________ weight, or an exercise band or tubing, so that your hand is "thumbs-up," like you are shaking hands.  Slowly lift your right / left arm in a "V" away from your thigh, diagonally into the space between your side and  straight ahead. Lift your hand to shoulder height or as far as you can, without increasing any shoulder pain. At first, many people do not lift their hands above shoulder height.  Avoid shrugging your right / left shoulder as your arm rises, by keeping your shoulder blade tucked down and toward your mid-back spine.  Hold for __________ seconds. Control the descent of your hand, as you slowly return to your starting position. Repeat __________ times. Complete this exercise __________ times per day.  STRENGTH - External Rotators  Secure a rubber exercise band or tubing to a fixed object (table, pole) so that it is at the same height as your right / left elbow when you are standing or sitting on a firm surface.  Stand or sit so that the secured exercise band  is at your uninjured side.  Bend your right / left elbow 90 degrees. Place a folded towel or small pillow under your right / left arm, so that your elbow is a few inches away from your side.  Keeping the tension on the exercise band, pull it away from your body, as if pivoting on your elbow. Be sure to keep your body steady, so that the movement is coming only from your rotating shoulder.  Hold for __________ seconds. Release the tension in a controlled manner, as you return to the starting position. Repeat __________ times. Complete this exercise __________ times per day.  STRENGTH - Internal Rotators   Secure a rubber exercise band or tubing to a fixed object (table, pole) so that it is at the same height as your right / left elbow when you are standing or sitting on a firm surface.  Stand or sit so that the secured exercise band is at your right / left side.  Bend your elbow 90 degrees. Place a folded towel or small pillow under your right / left arm so that your elbow is a few inches away from your side.  Keeping the tension on the exercise band, pull it across your body, toward your stomach. Be sure to keep your body steady, so that  the movement is coming only from your rotating shoulder.  Hold for __________ seconds. Release the tension in a controlled manner, as you return to the starting position. Repeat __________ times. Complete this exercise __________ times per day.  STRENGTH - Scapular Protractors, Standing   Stand arms length away from a wall. Place your hands on the wall, keeping your elbows straight.  Begin by dropping your shoulder blades down and toward your mid-back spine.  To strengthen your protractors, keep your shoulder blades down, but slide them forward on your rib cage. It will feel as if you are lifting the back of your rib cage away from the wall. This is a subtle motion and can be challenging to complete. Ask your caregiver for further instruction, if you are not sure you are doing the exercise correctly.  Hold for __________ seconds. Slowly return to the starting position, resting the muscles completely before starting the next repetition. Repeat __________ times. Complete this exercise __________ times per day. STRENGTH - Scapular Protractors, Supine  Lie on your back on a firm surface. Extend your right / left arm straight into the air while holding a __________ weight in your hand.  Keeping your head and back in place, lift your shoulder off the floor.  Hold for __________ seconds. Slowly return to the starting position, and allow your muscles to relax completely before starting the next repetition. Repeat __________ times. Complete this exercise __________ times per day. STRENGTH - Scapular Protractors, Quadruped  Get onto your hands and knees, with your shoulders directly over your hands (or as close as you can be, comfortably).  Keeping your elbows locked, lift the back of your rib cage up into your shoulder blades, so your mid-back rounds out. Keep your neck muscles relaxed.  Hold this position for __________ seconds. Slowly return to the starting position and allow your muscles to  relax completely before starting the next repetition. Repeat __________ times. Complete this exercise __________ times per day.  STRENGTH - Scapular Retractors  Secure a rubber exercise band or tubing to a fixed object (table, pole), so that it is at the height of your shoulders when you are either standing, or sitting on  a firm armless chair.  With a palm down grip, grasp an end of the band in each hand. Straighten your elbows and lift your hands straight in front of you, at shoulder height. Step back, away from the secured end of the band, until it becomes tense.  Squeezing your shoulder blades together, draw your elbows back toward your sides, as you bend them. Keep your upper arms lifted away from your body throughout the exercise.  Hold for __________ seconds. Slowly ease the tension on the band, as you reverse the directions and return to the starting position. Repeat __________ times. Complete this exercise __________ times per day. STRENGTH - Shoulder Extensors   Secure a rubber exercise band or tubing to a fixed object (table, pole) so that it is at the height of your shoulders when you are either standing, or sitting on a firm armless chair.  With a thumbs-up grip, grasp an end of the band in each hand. Straighten your elbows and lift your hands straight in front of you, at shoulder height. Step back, away from the secured end of the band, until it becomes tense.  Squeezing your shoulder blades together, pull your hands down to the sides of your thighs. Do not allow your hands to go behind you.  Hold for __________ seconds. Slowly ease the tension on the band, as you reverse the directions and return to the starting position. Repeat __________ times. Complete this exercise __________ times per day.  STRENGTH - Scapular Retractors and External Rotators   Secure a rubber exercise band or tubing to a fixed object (table, pole) so that it is at the height as your shoulders, when you are  either standing, or sitting on a firm armless chair.  With a palm down grip, grasp an end of the band in each hand. Bend your elbows 90 degrees and lift your elbows to shoulder height, at your sides. Step back, away from the secured end of the band, until it becomes tense.  Squeezing your shoulder blades together, rotate your shoulders so that your upper arms and elbows remain stationary, but your fists travel upward to head height.  Hold for __________ seconds. Slowly ease the tension on the band, as you reverse the directions and return to the starting position. Repeat __________ times. Complete this exercise __________ times per day.  STRENGTH - Scapular Retractors and External Rotators, Rowing   Secure a rubber exercise band or tubing to a fixed object (table, pole) so that it is at the height of your shoulders, when you are either standing, or sitting on a firm armless chair.  With a palm down grip, grasp an end of the band in each hand. Straighten your elbows and lift your hands straight in front of you, at shoulder height. Step back, away from the secured end of the band, until it becomes tense.  Step 1: Squeeze your shoulder blades together. Bending your elbows, draw your hands to your chest, as if you are rowing a boat. At the end of this motion, your hands and elbow should be at shoulder height and your elbows should be out to your sides.  Step 2: Rotate your shoulders, to raise your hands above your head. Your forearms should be vertical and your upper arms should be horizontal.  Hold for __________ seconds. Slowly ease the tension on the band, as you reverse the directions and return to the starting position. Repeat __________ times. Complete this exercise __________ times per day.  STRENGTH -  Scapular Depressors  Find a sturdy chair without wheels, such as a dining room chair.  Keeping your feet on the floor, and your hands on the chair arms, lift your bottom up from the seat,  and lock your elbows.  Keeping your elbows straight, allow gravity to pull your body weight down. Your shoulders will rise toward your ears.  Raise your body against gravity by drawing your shoulder blades down your back, shortening the distance between your shoulders and ears. Although your feet should always maintain contact with the floor, your feet should progressively support less body weight, as you get stronger.  Hold for __________ seconds. In a controlled and slow manner, lower your body weight to begin the next repetition. Repeat __________ times. Complete this exercise __________ times per day.  Document Released: 11/19/2005 Document Revised: 02/11/2012 Document Reviewed: 03/03/2009 [facility] Patient Information 2015 [place], [place]. This information is not intended to replace advice given to you by your health care provider. Make sure you discuss any questions you have with your health care provider.

## 2015-01-07 NOTE — Progress Notes (Signed)
   Subjective:    Patient ID: Lauren Wade, female    DOB: 1981-08-02, 34 y.o.   MRN: [id number]  HPI  Pt is a 34 yo female who presents to the clinic to establish care.   .. Active Ambulatory Problems    Diagnosis Date Noted  . BRONCHITIS 01/21/2010  . GAD (generalized anxiety disorder) 01/07/2015  . Cervical dysplasia 01/07/2015  . Abnormal weight gain 01/07/2015  . Obese 01/07/2015  . Right shoulder pain 01/11/2015   Resolved Ambulatory Problems    Diagnosis Date Noted  . No Resolved Ambulatory Problems   Past Medical History  Diagnosis Date  . IBS (irritable bowel syndrome)    .Marland Kitchen. Family History  Problem Relation Age of Onset  . Hyperlipidemia Father   . Depression Mother   . Depression Paternal Aunt    .Marland Kitchen. History   Social History  . Marital Status: Married    Spouse Name: N/A    Number of Children: N/A  . Years of Education: N/A   Occupational History  . Not on file.   Social History Main Topics  . Smoking status: Former Games developermoker  . Smokeless tobacco: Not on file  . Alcohol Use: Yes  . Drug Use: No  . Sexual Activity: Yes   Other Topics Concern  . Not on file   Social History Narrative   Pt is recently started lexapro and counseling. Anxiety seems to be worsening with work load and family life.   Pt is ready to lose weight. She gained 80+ pounds between her pregnancies. She is walking but needs help.   She has also had right shoulder pain for 3 years. Started when she did crossfit. Pain is annoying more than intense. Upper back around shoulder blade is where most of her pain is. Worse since she had son. ROM is normal. Helps when husband massages and digs into with his elbow. Ibuprofen knocks off some of discomfort. No known injury.     Review of Systems  All other systems reviewed and are negative.      Objective:   Physical Exam  Constitutional: She is oriented to person, place, and time. She appears well-developed and well-nourished.   HENT:  Head: Normocephalic and atraumatic.  Cardiovascular: Normal rate, regular rhythm and normal heart sounds.   Pulmonary/Chest: Effort normal and breath sounds normal.  Musculoskeletal:  Full ROM of right shoulder.  Negative hawkins, empty can Strength 5/5.  No pain over bony landmarks of right shoulder.  Tenderness over muscles around upper scapula that connect to clavicle(supraspinatus).   Neurological: She is alert and oriented to person, place, and time.  Skin: Skin is dry.  Psychiatric: She has a normal mood and affect. Her behavior is normal.          Assessment & Plan:  GAD- managed by psych and counseling through [facility]. Just started lexapro.   Obese/abnormal weight gain- discussed weight loss options. Opted to start phentermine. Discussed diet and exercise plan. Reviewed side effects. Follow up in one month for weight and vital recheck.   Right shoulder pain- seems like posterior rotator cuff tendonitis. Given stretches and mobic. Follow up if not improving. Offered flexeril pt declined.

## 2015-01-11 DIAGNOSIS — M25511 Pain in right shoulder: Secondary | ICD-10-CM | POA: Insufficient documentation

## 2015-02-16 ENCOUNTER — Encounter: Payer: Self-pay | Admitting: Physician Assistant

## 2015-02-16 ENCOUNTER — Ambulatory Visit (INDEPENDENT_AMBULATORY_CARE_PROVIDER_SITE_OTHER): Payer: BC Managed Care – PPO | Admitting: Physician Assistant

## 2015-02-16 VITALS — BP 128/84 | HR 95 | Ht 62.0 in | Wt 172.0 lb

## 2015-02-16 DIAGNOSIS — E669 Obesity, unspecified: Secondary | ICD-10-CM | POA: Diagnosis not present

## 2015-02-16 DIAGNOSIS — R635 Abnormal weight gain: Secondary | ICD-10-CM | POA: Diagnosis not present

## 2015-02-16 MED ORDER — PHENTERMINE HCL 37.5 MG PO TABS: 37.5000 mg | ORAL_TABLET | Freq: Every day | ORAL | Status: DC

## 2015-02-16 NOTE — Progress Notes (Signed)
Subjective:    Patient ID: Lauren Wade, female    DOB: 06/16/1981, 34 y.o.   MRN: [id number]  HPI Patient is a 34-year-old female who presents to the clinic to follow-up on one month of phentermine. She has made significant diet changes. She is starting to count her calories. She met she has much more energy. She has not started an exercise routine at this time. She is down 13 pounds in one month. She has no complaints or side effects. The first couple day she did feel little more anxious but take half tab until she began to tolerate better.   Review of Systems  All other systems reviewed and are negative.      Objective:   Physical Exam  Constitutional: She is oriented to person, place, and time. She appears well-developed and well-nourished.  HENT:  Head: Normocephalic and atraumatic.  Cardiovascular: Normal rate, regular rhythm and normal heart sounds.   Pulmonary/Chest: Effort normal and breath sounds normal.  Neurological: She is alert and oriented to person, place, and time.  Skin: Skin is dry.  Psychiatric: She has a normal mood and affect. Her behavior is normal.          Assessment & Plan:  Obesity/abnormal weight gain-patient is doing great on phentermine. Will refill for 2 months. She can follow-up with nurse visit in 2 months. Encouraged her to incorporate exercise into her diet changes. 

## 2015-02-18 ENCOUNTER — Ambulatory Visit: Payer: BC Managed Care – PPO | Admitting: Physician Assistant

## 2015-04-26 ENCOUNTER — Ambulatory Visit (INDEPENDENT_AMBULATORY_CARE_PROVIDER_SITE_OTHER): Payer: BC Managed Care – PPO | Admitting: Family Medicine

## 2015-04-26 VITALS — BP 129/85 | HR 95 | Wt 161.0 lb

## 2015-04-26 DIAGNOSIS — Z6829 Body mass index (BMI) 29.0-29.9, adult: Secondary | ICD-10-CM

## 2015-04-26 DIAGNOSIS — R635 Abnormal weight gain: Secondary | ICD-10-CM

## 2015-04-26 MED ORDER — PHENTERMINE HCL 37.5 MG PO TABS: 37.5000 mg | ORAL_TABLET | Freq: Every day | ORAL | Status: DC

## 2015-04-26 NOTE — Progress Notes (Signed)
   Subjective:    Patient ID: Lauren SinclairAmber M Umali, female    DOB: 1981/10/07, 34 y.o.   MRN: [id number]  HPI  Patient is here for blood pressure and weight check. Denies any trouble sleeping, palpitations, or any other medication problems. Patient states she has been making some dietary changes but has not yet began working out.    Review of Systems     Objective:   Physical Exam        Assessment & Plan:  Patient has lost weight. A refill for Phentermine will be sent to patient preferred pharmacy. Patient advised to schedule a four week nurse visit and keep her upcoming appointment with her PCP. Verbalized understanding, no further questions.   Agree with above. Down 11 lbs.   Nani Gasseratherine Metheney, MD

## 2015-05-04 ENCOUNTER — Ambulatory Visit (INDEPENDENT_AMBULATORY_CARE_PROVIDER_SITE_OTHER): Payer: BC Managed Care – PPO | Admitting: Family Medicine

## 2015-05-04 ENCOUNTER — Encounter: Payer: Self-pay | Admitting: Family Medicine

## 2015-05-04 VITALS — BP 125/79 | HR 96 | Temp 97.7°F | Wt 160.0 lb

## 2015-05-04 DIAGNOSIS — K921 Melena: Secondary | ICD-10-CM

## 2015-05-04 DIAGNOSIS — R1011 Right upper quadrant pain: Secondary | ICD-10-CM | POA: Diagnosis not present

## 2015-05-04 DIAGNOSIS — K589 Irritable bowel syndrome without diarrhea: Secondary | ICD-10-CM

## 2015-05-04 MED ORDER — LUBIPROSTONE 8 MCG PO CAPS: 8.0000 ug | ORAL_CAPSULE | Freq: Two times a day (BID) | ORAL | Status: DC

## 2015-05-04 NOTE — Progress Notes (Signed)
CC: Lauren Wade is a 34 y.o. female is here for Blood In Stools and Abdominal Pain   Subjective: HPI:  Right upper quadrant pain that has been present for a matter of years. Over the past 1 or 2 months it's been worsening. It's producible occurring after every meal but can also occur at random times throughout the day. It radiates into the right shoulder. It's accompanied by nausea when pain is severe. She's never had vomiting or diarrhea with this. No unintentional weight loss. She had a sonogram over a year ago that showed a "mushy" gallbladder. Symptoms were improved after she reduced fat in her diet.  She also complains of a small amount of blood in her stool with every bowel movement. It's been accompanied by constipation where she feels like she has to go defecate but will either be unable to pass a bowel movement for has just a small amount of a normal caliber piece of feces. She denies melanoma or clots in her stool. She also has a white string that seems to be mixed within the bowel movements. She has some left lower quadrant and left upper quadrant pain prior to a bowel movement that's alleviated by the bowel movement. She denies any shortness of breath, rapid heartbeat, chest pain, nor limb claudication. Denies any other bleeding problems. Carries a remote diagnosis of IBS which has caused identical pain and bowel movement symptoms but in the past was improved with increasing fiber which she started to try.   Review Of Systems Outlined In HPI  Past Medical History  Diagnosis Date  . IBS (irritable bowel syndrome)     Past Surgical History  Procedure Laterality Date  . Removal of cervical ca     Family History  Problem Relation Age of Onset  . Hyperlipidemia Father   . Depression Mother   . Depression Paternal Aunt     History   Social History  . Marital Status: Married    Spouse Name: N/A  . Number of Children: N/A  . Years of Education: N/A   Occupational History   . Not on file.   Social History Main Topics  . Smoking status: Former Games developer  . Smokeless tobacco: Not on file  . Alcohol Use: Yes  . Drug Use: No  . Sexual Activity: Yes   Other Topics Concern  . Not on file   Social History Narrative     Objective: BP 125/79 mmHg  Pulse 96  Temp(Src) 97.7 F (36.5 C) (Oral)  Wt 160 lb (72.576 kg)  Vital signs reviewed. General: Alert and Oriented, No Acute Distress HEENT: Pupils equal, round, reactive to light. Conjunctivae clear.  External ears unremarkable.  Moist mucous membranes. Lungs: Clear and comfortable work of breathing, speaking in full sentences without accessory muscle use. Cardiac: Regular rate and rhythm.  Neuro: CN II-XII grossly intact, gait normal. Abdomen: Mild obesity soft with right upper quadrant pain Extremities: No peripheral edema.  Strong peripheral pulses.  Mental Status: No depression, anxiety, nor agitation. Logical though process. Skin: Warm and dry.  Assessment & Plan: Lauren Wade was seen today for blood in stools and abdominal pain.  Diagnoses and all orders for this visit:  RUQ pain Orders: -     Ova and parasite examination -     Gamma GT -     COMPLETE METABOLIC PANEL WITH GFR  Blood in stool Orders: -     Ova and parasite examination -     COMPLETE METABOLIC PANEL  WITH GFR  Irritable bowel syndrome (IBS) Orders: -     lubiprostone (AMITIZA) 8 MCG capsule; Take 1 capsule (8 mcg total) by mouth 2 (two) times daily with a meal.   Right upper quadrant pain: Checking GGT and liver function high suspicion for gallbladder pathology therefore labs are normal will repeat ultrasound. Blood in stool: Suspect is due to constipation and a internal hemorrhoid therefore start Amitiza if not better in 1-2 weeks or worsening in this time Will refer to gastroenterology  Return if symptoms worsen or fail to improve.

## 2015-05-05 ENCOUNTER — Telehealth: Payer: Self-pay | Admitting: Family Medicine

## 2015-05-05 DIAGNOSIS — R1011 Right upper quadrant pain: Secondary | ICD-10-CM

## 2015-05-05 LAB — COMPLETE METABOLIC PANEL WITH GFR
ALBUMIN: 4 g/dL (ref 3.5–5.2)
ALK PHOS: 62 U/L (ref 39–117)
ALT: 9 U/L (ref 0–35)
AST: 12 U/L (ref 0–37)
BUN: 8 mg/dL (ref 6–23)
CHLORIDE: 106 meq/L (ref 96–112)
CO2: 23 mEq/L (ref 19–32)
Calcium: 9 mg/dL (ref 8.4–10.5)
Creat: 0.9 mg/dL (ref 0.50–1.10)
GFR, EST NON AFRICAN AMERICAN: 84 mL/min
GFR, Est African American: >89 mL/min
Glucose, Bld: 75 mg/dL (ref 70–99)
Potassium: 4.2 mEq/L (ref 3.5–5.3)
SODIUM: 139 meq/L (ref 135–145)
TOTAL PROTEIN: 6.3 g/dL (ref 6.0–8.3)
Total Bilirubin: 0.7 mg/dL (ref 0.2–1.2)

## 2015-05-05 LAB — GAMMA GT: GGT: 10 U/L (ref 7–51)

## 2015-05-05 NOTE — Telephone Encounter (Signed)
Lauren Wade, Will you please let patient know that her blood work was normal.  Since my suspicion of cholecystitis is high I'd recommend having an ultrasound of the abdomen, order has been placed.

## 2015-05-05 NOTE — Telephone Encounter (Signed)
Pt.notified

## 2015-05-09 ENCOUNTER — Telehealth: Payer: Self-pay | Admitting: Family Medicine

## 2015-05-09 ENCOUNTER — Ambulatory Visit (INDEPENDENT_AMBULATORY_CARE_PROVIDER_SITE_OTHER): Payer: BC Managed Care – PPO

## 2015-05-09 DIAGNOSIS — R1011 Right upper quadrant pain: Secondary | ICD-10-CM | POA: Diagnosis not present

## 2015-05-09 NOTE — Telephone Encounter (Signed)
Sue Lushndrea, Will you please let patient know that her US showed some shadows that could be reflective of tiny stones in the duct that drains the gallbladder, the radiologist has recommend a MRI of this duct and I've placed an order for this.  Please let me know if not contacted by the end of the week about scheduling.

## 2015-05-10 NOTE — Telephone Encounter (Signed)
Pt.notified

## 2015-05-16 ENCOUNTER — Other Ambulatory Visit: Payer: Self-pay | Admitting: *Deleted

## 2015-05-16 ENCOUNTER — Ambulatory Visit (INDEPENDENT_AMBULATORY_CARE_PROVIDER_SITE_OTHER): Payer: BC Managed Care – PPO

## 2015-05-16 DIAGNOSIS — N281 Cyst of kidney, acquired: Secondary | ICD-10-CM | POA: Diagnosis not present

## 2015-05-16 DIAGNOSIS — R1011 Right upper quadrant pain: Secondary | ICD-10-CM

## 2015-05-16 MED ORDER — GADOBENATE DIMEGLUMINE 529 MG/ML IV SOLN
15.0000 mL | Freq: Once | INTRAVENOUS | Status: AC | PRN
  Administered 2015-05-16: 15 mL via INTRAVENOUS

## 2015-05-17 LAB — OVA AND PARASITE EXAMINATION: OP: NONE SEEN

## 2015-08-26 ENCOUNTER — Encounter: Payer: Self-pay | Admitting: Physician Assistant

## 2015-08-26 ENCOUNTER — Ambulatory Visit (INDEPENDENT_AMBULATORY_CARE_PROVIDER_SITE_OTHER): Payer: BC Managed Care – PPO | Admitting: Physician Assistant

## 2015-08-26 ENCOUNTER — Ambulatory Visit (INDEPENDENT_AMBULATORY_CARE_PROVIDER_SITE_OTHER): Payer: BC Managed Care – PPO

## 2015-08-26 VITALS — BP 125/64 | HR 92 | Ht 62.0 in | Wt 150.0 lb

## 2015-08-26 DIAGNOSIS — R635 Abnormal weight gain: Secondary | ICD-10-CM

## 2015-08-26 DIAGNOSIS — M25511 Pain in right shoulder: Secondary | ICD-10-CM

## 2015-08-26 MED ORDER — PHENTERMINE HCL 37.5 MG PO TABS: 37.5000 mg | ORAL_TABLET | Freq: Every day | ORAL | Status: DC

## 2015-08-26 MED ORDER — MELOXICAM 15 MG PO TABS: 15.0000 mg | ORAL_TABLET | Freq: Every day | ORAL | Status: DC

## 2015-08-26 NOTE — Addendum Note (Signed)
Addended by: Jomarie Longs on: 08/26/2015 05:08 PM   Modules accepted: Orders

## 2015-08-26 NOTE — Progress Notes (Addendum)
.   Lauren Wade is a 34 y.o. female who presents to [facility]  today for follow up of medication assisted weight loss. She has lost 10 lbs since her last visit on 6/1. She reports feeling well on the phentermine, and denies side effects of insomnia and anxiety. She states she has been eating a healthy diet, and phentermine has reduced her emotional eating. She is a Runner, broadcasting/film/video, and she would like to continue phentermine for one more month as she adjusts to the new school year.   Right shoulder pain: She also complains of right shoulder pain. Pain has been ongoing for years, and radiates from her shoulder up her neck and down her arm. She has seen chiropractor but no real relief. Massages help some. No known trauma.     Past Medical History  Diagnosis Date  . IBS (irritable bowel syndrome)    Past Surgical History  Procedure Laterality Date  . Removal of cervical ca     Social History  Substance Use Topics  . Smoking status: Former Games developer  . Smokeless tobacco: Not on file  . Alcohol Use: Yes   family history includes Depression in her mother and paternal aunt; Hyperlipidemia in her father.  ROS as above Medications: Current Outpatient Prescriptions  Medication Sig Dispense Refill  . lubiprostone (AMITIZA) 8 MCG capsule Take 1 capsule (8 mcg total) by mouth 2 (two) times daily with a meal. 60 capsule 1  . phentermine (ADIPEX-P) 37.5 MG tablet Take 1 tablet (37.5 mg total) by mouth daily before breakfast. 60 tablet 0   No current facility-administered medications for this visit.   Allergies  Allergen Reactions  . Paxil [Paroxetine Hcl] Nausea And Vomiting  . Zoloft [Sertraline Hcl] Other (See Comments)    Body shakes     Exam:  There were no vitals taken for this visit. Gen: Well NAD HEENT: EOMI,  MMM Lungs: Normal work of breathing. CTABL Heart: RRR no MRG MS: Right shoulder: no tenderness to palpation over right shoulder bony  landmarks. Passive and active ROM intact. Positive empty can, Neer, and Hawkins test.   No results found for this or any previous visit (from the past 24 hour(s)). No results found.   Assessment and Plan:  Abnormal weight gain- Continue phentermine for 1 month and then taper off. Rx 60 37.5 mg tablets. Continue with good habits.   Right Shoulder pain- will get xray today. Will start PT. mobic given to start daily. Discussed to take with food.I feel like there is some impingement syndrome. Will follow up with sports medicine provider in office.   Reviewed and changed made by Tandy Gaw PA-C

## 2015-11-11 ENCOUNTER — Ambulatory Visit: Payer: BC Managed Care – PPO | Admitting: Physician Assistant

## 2015-11-15 ENCOUNTER — Encounter: Payer: Self-pay | Admitting: Physician Assistant

## 2015-11-15 ENCOUNTER — Ambulatory Visit (INDEPENDENT_AMBULATORY_CARE_PROVIDER_SITE_OTHER): Payer: BC Managed Care – PPO | Admitting: Physician Assistant

## 2015-11-15 VITALS — BP 110/80 | HR 106 | Ht 62.0 in | Wt 142.0 lb

## 2015-11-15 DIAGNOSIS — M25511 Pain in right shoulder: Secondary | ICD-10-CM

## 2015-11-15 MED ORDER — CYCLOBENZAPRINE HCL 10 MG PO TABS: 10.0000 mg | ORAL_TABLET | Freq: Three times a day (TID) | ORAL | Status: DC | PRN

## 2015-11-15 MED ORDER — DICLOFENAC SODIUM 75 MG PO TBEC: 75.0000 mg | DELAYED_RELEASE_TABLET | Freq: Two times a day (BID) | ORAL | Status: DC

## 2015-11-15 NOTE — Progress Notes (Signed)
   Subjective:    Patient ID: Lauren SinclairAmber M Barrales, female    DOB: [birth year], 34 y.o.   MRN: [id number]  HPI  Pt presents to the clinic with un resolving right shoulder pain for last few years. Patient is not aware of any specific injury that happened to right shoulder. She noticed the pain starting years ago when she was working out. The pain has just slowly progressed into constant pain. She now has discomfort all day and night. She's been to a chiropractor and does not seem to help. She has had massages with little relief. She took the [employer] for 2 weeks with no relief. She notices she chose to avoid doing things with her right arm. The pain is localized around her right shoulder and into the upper back muscles. She denies any numbness or tingling. She was never called about formal physical therapy from her last visit.     Review of Systems  All other systems reviewed and are negative.      Objective:   Physical Exam  Constitutional: She is oriented to person, place, and time. She appears well-developed and well-nourished.  HENT:  Head: Normocephalic and atraumatic.  Musculoskeletal:  Normal range of motion of right shoulder but certainly more hesitant with right versus left. Tenderness over or coracoid process and anterior lateral acromion process.  Strength of right arm decrease to 4 out of 5. Negative drop arm, bilaterally.  Positive Hawkins.  Empty can produced pain.  Hand grip 5/5.    Neurological: She is alert and oriented to person, place, and time.  Psychiatric: She has a normal mood and affect. Her behavior is normal.          Assessment & Plan:  Right shoulder pain- likely bursitis with some impingement syndrome. Given exercises to start at home. Given number to call PT next door to start formal PT. Will try diclofenac as NsAID since mobic did not work. Injection given today. Follow up with sports medicine if not improving. May need imaging if no improvement.    Shoulder Injection Procedure Note  Pre-operative Diagnosis: right shoulder pain   Post-operative Diagnosis: right shoulder pain  Indications: relief for right shoulder pain   Anesthesia: ethyl chloride  Procedure Details   Verbal consent was obtained for the procedure. The shoulder was prepped with iodine and the skin was anesthetized. Using a 22 gauge needle the glenohumeral joint is injected with 9 mL 1% lidocaine and 1 mL of depo medrol 40mg  under the posterior aspect of the acromion. The injection site was cleansed with topical isopropyl alcohol and a dressing was applied.  Complications:  None; patient tolerated the procedure well.

## 2015-11-15 NOTE — Patient Instructions (Signed)

## 2015-11-16 ENCOUNTER — Encounter: Payer: Self-pay | Admitting: Physician Assistant

## 2016-01-13 ENCOUNTER — Ambulatory Visit (INDEPENDENT_AMBULATORY_CARE_PROVIDER_SITE_OTHER): Payer: BC Managed Care – PPO | Admitting: Physician Assistant

## 2016-01-13 ENCOUNTER — Encounter: Payer: Self-pay | Admitting: Physician Assistant

## 2016-01-13 VITALS — BP 111/77 | HR 84 | Temp 98.2°F | Ht 62.0 in | Wt 142.0 lb

## 2016-01-13 DIAGNOSIS — J0100 Acute maxillary sinusitis, unspecified: Secondary | ICD-10-CM

## 2016-01-13 DIAGNOSIS — J219 Acute bronchiolitis, unspecified: Secondary | ICD-10-CM

## 2016-01-13 MED ORDER — HYDROCOD POLST-CPM POLST ER 10-8 MG/5ML PO SUER: 5.0000 mL | Freq: Two times a day (BID) | ORAL | Status: DC | PRN

## 2016-01-13 MED ORDER — AZITHROMYCIN 250 MG PO TABS: ORAL_TABLET | ORAL | Status: DC

## 2016-01-13 NOTE — Patient Instructions (Signed)
Acute Bronchitis Bronchitis is inflammation of the airways that extend from the windpipe into the lungs (bronchi). The inflammation often causes mucus to develop. This leads to a cough, which is the most common symptom of bronchitis.  In acute bronchitis, the condition usually develops suddenly and goes away over time, usually in a couple weeks. Smoking, allergies, and asthma can make bronchitis worse. Repeated episodes of bronchitis may cause further lung problems.  CAUSES Acute bronchitis is most often caused by the same virus that causes a cold. The virus can spread from person to person (contagious) through coughing, sneezing, and touching contaminated objects. SIGNS AND SYMPTOMS   Cough.   Fever.   Coughing up mucus.   Body aches.   Chest congestion.   Chills.   Shortness of breath.   Sore throat.  DIAGNOSIS  Acute bronchitis is usually diagnosed through a physical exam. Your health care provider will also ask you questions about your medical history. Tests, such as chest X-rays, are sometimes done to rule out other conditions.  TREATMENT  Acute bronchitis usually goes away in a couple weeks. Oftentimes, no medical treatment is necessary. Medicines are sometimes given for relief of fever or cough. Antibiotic medicines are usually not needed but may be prescribed in certain situations. In some cases, an inhaler may be recommended to help reduce shortness of breath and control the cough. A cool mist vaporizer may also be used to help thin bronchial secretions and make it easier to clear the chest.  HOME CARE INSTRUCTIONS  Get plenty of rest.   Drink enough fluids to keep your urine clear or pale yellow (unless you have a medical condition that requires fluid restriction). Increasing fluids may help thin your respiratory secretions (sputum) and reduce chest congestion, and it will prevent dehydration.   Take medicines only as directed by your health care provider.  If  you were prescribed an antibiotic medicine, finish it all even if you start to feel better.  Avoid smoking and secondhand smoke. Exposure to cigarette smoke or irritating chemicals will make bronchitis worse. If you are a smoker, consider using nicotine gum or skin patches to help control withdrawal symptoms. Quitting smoking will help your lungs heal faster.   Reduce the chances of another bout of acute bronchitis by washing your hands frequently, avoiding people with cold symptoms, and trying not to touch your hands to your mouth, nose, or eyes.   Keep all follow-up visits as directed by your health care provider.  SEEK MEDICAL CARE IF: Your symptoms do not improve after 1 week of treatment.  SEEK IMMEDIATE MEDICAL CARE IF:  You develop an increased fever or chills.   You have chest pain.   You have severe shortness of breath.  You have bloody sputum.   You develop dehydration.  You faint or repeatedly feel like you are going to pass out.  You develop repeated vomiting.  You develop a severe headache. MAKE SURE YOU:   Understand these instructions.  Will watch your condition.  Will get help right away if you are not doing well or get worse.   This information is not intended to replace advice given to you by your health care provider. Make sure you discuss any questions you have with your health care provider.   Document Released: 12/27/2004 Document Revised: 12/10/2014 Document Reviewed: 05/12/2013 Elsevier Interactive Patient Education 2016 Elsevier Inc. Sinusitis, Adult Sinusitis is redness, soreness, and inflammation of the paranasal sinuses. Paranasal sinuses are air pockets within the   bones of your face. They are located beneath your eyes, in the middle of your forehead, and above your eyes. In healthy paranasal sinuses, mucus is able to drain out, and air is able to circulate through them by way of your nose. However, when your paranasal sinuses are inflamed,  mucus and air can become trapped. This can allow bacteria and other germs to grow and cause infection. Sinusitis can develop quickly and last only a short time (acute) or continue over a long period (chronic). Sinusitis that lasts for more than 12 weeks is considered chronic. CAUSES Causes of sinusitis include:  Allergies.  Structural abnormalities, such as displacement of the cartilage that separates your nostrils (deviated septum), which can decrease the air flow through your nose and sinuses and affect sinus drainage.  Functional abnormalities, such as when the small hairs (cilia) that line your sinuses and help remove mucus do not work properly or are not present. SIGNS AND SYMPTOMS Symptoms of acute and chronic sinusitis are the same. The primary symptoms are pain and pressure around the affected sinuses. Other symptoms include:  Upper toothache.  Earache.  Headache.  Bad breath.  Decreased sense of smell and taste.  A cough, which worsens when you are lying flat.  Fatigue.  Fever.  Thick drainage from your nose, which often is green and may contain pus (purulent).  Swelling and warmth over the affected sinuses. DIAGNOSIS Your health care provider will perform a physical exam. During your exam, your health care provider may perform any of the following to help determine if you have acute sinusitis or chronic sinusitis:  Look in your nose for signs of abnormal growths in your nostrils (nasal polyps).  Tap over the affected sinus to check for signs of infection.  View the inside of your sinuses using an imaging device that has a light attached (endoscope). If your health care provider suspects that you have chronic sinusitis, one or more of the following tests may be recommended:  Allergy tests.  Nasal culture. A sample of mucus is taken from your nose, sent to a lab, and screened for bacteria.  Nasal cytology. A sample of mucus is taken from your nose and examined by  your health care provider to determine if your sinusitis is related to an allergy. TREATMENT Most cases of acute sinusitis are related to a viral infection and will resolve on their own within 10 days. Sometimes, medicines are prescribed to help relieve symptoms of both acute and chronic sinusitis. These may include pain medicines, decongestants, nasal steroid sprays, or saline sprays. However, for sinusitis related to a bacterial infection, your health care provider will prescribe antibiotic medicines. These are medicines that will help kill the bacteria causing the infection. Rarely, sinusitis is caused by a fungal infection. In these cases, your health care provider will prescribe antifungal medicine. For some cases of chronic sinusitis, surgery is needed. Generally, these are cases in which sinusitis recurs more than 3 times per year, despite other treatments. HOME CARE INSTRUCTIONS  Drink plenty of water. Water helps thin the mucus so your sinuses can drain more easily.  Use a humidifier.  Inhale steam 3-4 times a day (for example, sit in the bathroom with the shower running).  Apply a warm, moist washcloth to your face 3-4 times a day, or as directed by your health care provider.  Use saline nasal sprays to help moisten and clean your sinuses.  Take medicines only as directed by your health care provider.  If   you were prescribed either an antibiotic or antifungal medicine, finish it all even if you start to feel better. SEEK IMMEDIATE MEDICAL CARE IF:  You have increasing pain or severe headaches.  You have nausea, vomiting, or drowsiness.  You have swelling around your face.  You have vision problems.  You have a stiff neck.  You have difficulty breathing.   This information is not intended to replace advice given to you by your health care provider. Make sure you discuss any questions you have with your health care provider.   Document Released: 11/19/2005 Document  Revised: 12/10/2014 Document Reviewed: 12/04/2011 Elsevier Interactive Patient Education 2016 Elsevier Inc.  

## 2016-01-13 NOTE — Progress Notes (Addendum)
   Subjective:    Patient ID: Lauren Wade, female    DOB: [birth year], 35 y.o.   MRN: [id number]  HPI  Patient is a 35 year old female presenting with productive cough, chills, purulent rhinorrhea, and malaise for the past four days. Patient states that her cough is worse at night and is productive for colored chunks. Patient has pleuritic chest pain. Patient has numerous sick contacts, as she works at an [employer], [employer]. Patient has been trying to stay hydrated, uses throat lozenges, and drinks hot tea, which has provided her with limited relief. Patient denies abdominal pain, nausea, SOB, dysuria, diarrhea, and constipation. Patient states that she usually has one episode of bronchitis per year. Patient denies recent travel and has been afebrile. Patient is a non-smoker.   Review of Systems    Please see HPI.  Objective:   Physical Exam  Constitutional: She is oriented to person, place, and time. She appears well-developed and well-nourished. No distress.  HENT:  Head: Normocephalic and atraumatic.  Right Ear: External ear normal.  Left Ear: External ear normal.  Oropharynx has some coblestoning secondary to postnasal drip.   Nasal turbinates are erythematous  Patient has right sided maxillary sinus tenderness.   Eyes: Right eye exhibits no discharge. Left eye exhibits no discharge.  Neck: Normal range of motion.  Patient has mild thyroid fullness.   Cardiovascular: Normal rate, regular rhythm, normal heart sounds and intact distal pulses.   No murmur heard. Pulmonary/Chest: Effort normal and breath sounds normal. No respiratory distress. She has no wheezes. She has no rales.  Abdominal: Soft. Bowel sounds are normal. She exhibits no distension and no mass. There is no tenderness. There is no rebound and no guarding.  Musculoskeletal: Normal range of motion.  Neurological: She is alert and oriented to person, place, and time. No cranial nerve deficit.  Skin: Skin is warm and  dry. She is not diaphoretic. No erythema.  Psychiatric: She has a normal mood and affect. Her behavior is normal. Judgment and thought content normal.      Assessment & Plan:   1. Acute Bronchitis/acute sinusitis Patient has productive, purulent cough and rhinorrhea for the past four days. Patient has a past medical history relevant for bronchitis. Patient was prescribed Azithromycin and Tussionex to be used at night for cough. Patient was advised to stay hydrated and to get plenty of rest.

## 2016-02-14 ENCOUNTER — Ambulatory Visit (INDEPENDENT_AMBULATORY_CARE_PROVIDER_SITE_OTHER): Payer: BC Managed Care – PPO | Admitting: Physician Assistant

## 2016-02-14 ENCOUNTER — Encounter: Payer: Self-pay | Admitting: Physician Assistant

## 2016-02-14 VITALS — BP 133/80 | HR 86 | Ht 62.0 in | Wt 145.0 lb

## 2016-02-14 DIAGNOSIS — E663 Overweight: Secondary | ICD-10-CM | POA: Diagnosis not present

## 2016-02-14 DIAGNOSIS — R635 Abnormal weight gain: Secondary | ICD-10-CM

## 2016-02-14 MED ORDER — PHENTERMINE HCL 37.5 MG PO TABS: 37.5000 mg | ORAL_TABLET | Freq: Every day | ORAL | Status: DC

## 2016-02-14 NOTE — Progress Notes (Signed)
   Subjective:    Patient ID: Lauren Wade, female    DOB: Dec 28, 1980, 35 y.o.   MRN: [id number]  HPI Pt presents to the clinic to discuss going back on phentermine for weight loss. She did 3 months back in September 2016 and lost 30lbs she has gained 3lbs since stopping in November. She has 10 more lbs she wants to lose before a vacation.    Review of Systems See HPI.    Objective:   Physical Exam  Constitutional: She is oriented to person, place, and time. She appears well-developed and well-nourished.  Cardiovascular: Normal rate, regular rhythm and normal heart sounds.   Neurological: She is alert and oriented to person, place, and time.  Psychiatric: She has a normal mood and affect. Her behavior is normal.          Assessment & Plan:  Overweight/abnormal weight gain- BMI just above 25. Restarted phentermine. Discussed diet and exercise. Follow up in 1 month for nurse visit. Goal weight of 128-135.

## 2016-04-03 ENCOUNTER — Ambulatory Visit: Payer: BC Managed Care – PPO

## 2016-04-05 ENCOUNTER — Ambulatory Visit (INDEPENDENT_AMBULATORY_CARE_PROVIDER_SITE_OTHER): Payer: BC Managed Care – PPO | Admitting: Family Medicine

## 2016-04-05 VITALS — BP 110/66 | HR 76 | Wt 141.0 lb

## 2016-04-05 DIAGNOSIS — R635 Abnormal weight gain: Secondary | ICD-10-CM

## 2016-04-05 MED ORDER — PHENTERMINE HCL 37.5 MG PO TABS: 37.5000 mg | ORAL_TABLET | Freq: Every day | ORAL | Status: DC

## 2016-04-05 NOTE — Progress Notes (Signed)
Agree with above.  Catherine Metheney, MD  

## 2016-04-05 NOTE — Progress Notes (Signed)
Patient is here for blood pressure and weight check. Denies any trouble sleeping, palpitations, or any other medication problems. Patient does report the first time she tried Phentermine (a year or so ago) she noticed an increase in her energy - this time around she is not experiencing that same reaction. Patient reports the Rx simply curves her appetite. Patient has lost weight. A refill for Phentermine will be sent to patient preferred pharmacy. Patient advised to schedule a four week nurse visit and keep her upcoming appointment with her PCP. Verbalized understanding, no further questions.

## 2016-05-03 ENCOUNTER — Ambulatory Visit: Payer: BC Managed Care – PPO

## 2016-05-18 ENCOUNTER — Ambulatory Visit (INDEPENDENT_AMBULATORY_CARE_PROVIDER_SITE_OTHER): Payer: BC Managed Care – PPO | Admitting: Physician Assistant

## 2016-05-18 VITALS — BP 112/68 | HR 92 | Wt 142.0 lb

## 2016-05-18 DIAGNOSIS — R635 Abnormal weight gain: Secondary | ICD-10-CM

## 2016-05-18 MED ORDER — PHENTERMINE HCL 37.5 MG PO TABS: 37.5000 mg | ORAL_TABLET | Freq: Every day | ORAL | Status: DC

## 2016-05-18 NOTE — Progress Notes (Signed)
Patient is here for blood pressure and weight check. Denies any trouble sleeping, palpitations, or any other medication problems. Patient has not lost weight. Pt reports she has been out of Phentermine for one week. A refill for Phentermine will be sent to Provider for review. Patient advised if new Rx is sent in to schedule a four week nurse visit and keep her upcoming appointment with her PCP. Verbalized understanding, no further questions.  Will send one more rx. Needs to follow up with PCP at next recheck. I would cut in half to wean off seems as if you have platued. Tandy GawJade Breeback PA-C

## 2016-05-21 NOTE — Progress Notes (Signed)
Pt advised of recommendation and new Rx. Verbalized understanding. No further questions.

## 2016-06-09 IMAGING — CR DG SHOULDER 2+V*R*
3 series · 3 of 3 positions shown · non-contrast
Comparison: None.

CLINICAL DATA: Right shoulder pain for 5 years, no known injury,
initial encounter

EXAM:
RIGHT SHOULDER - 2+ VIEW

[shoulder grashey]
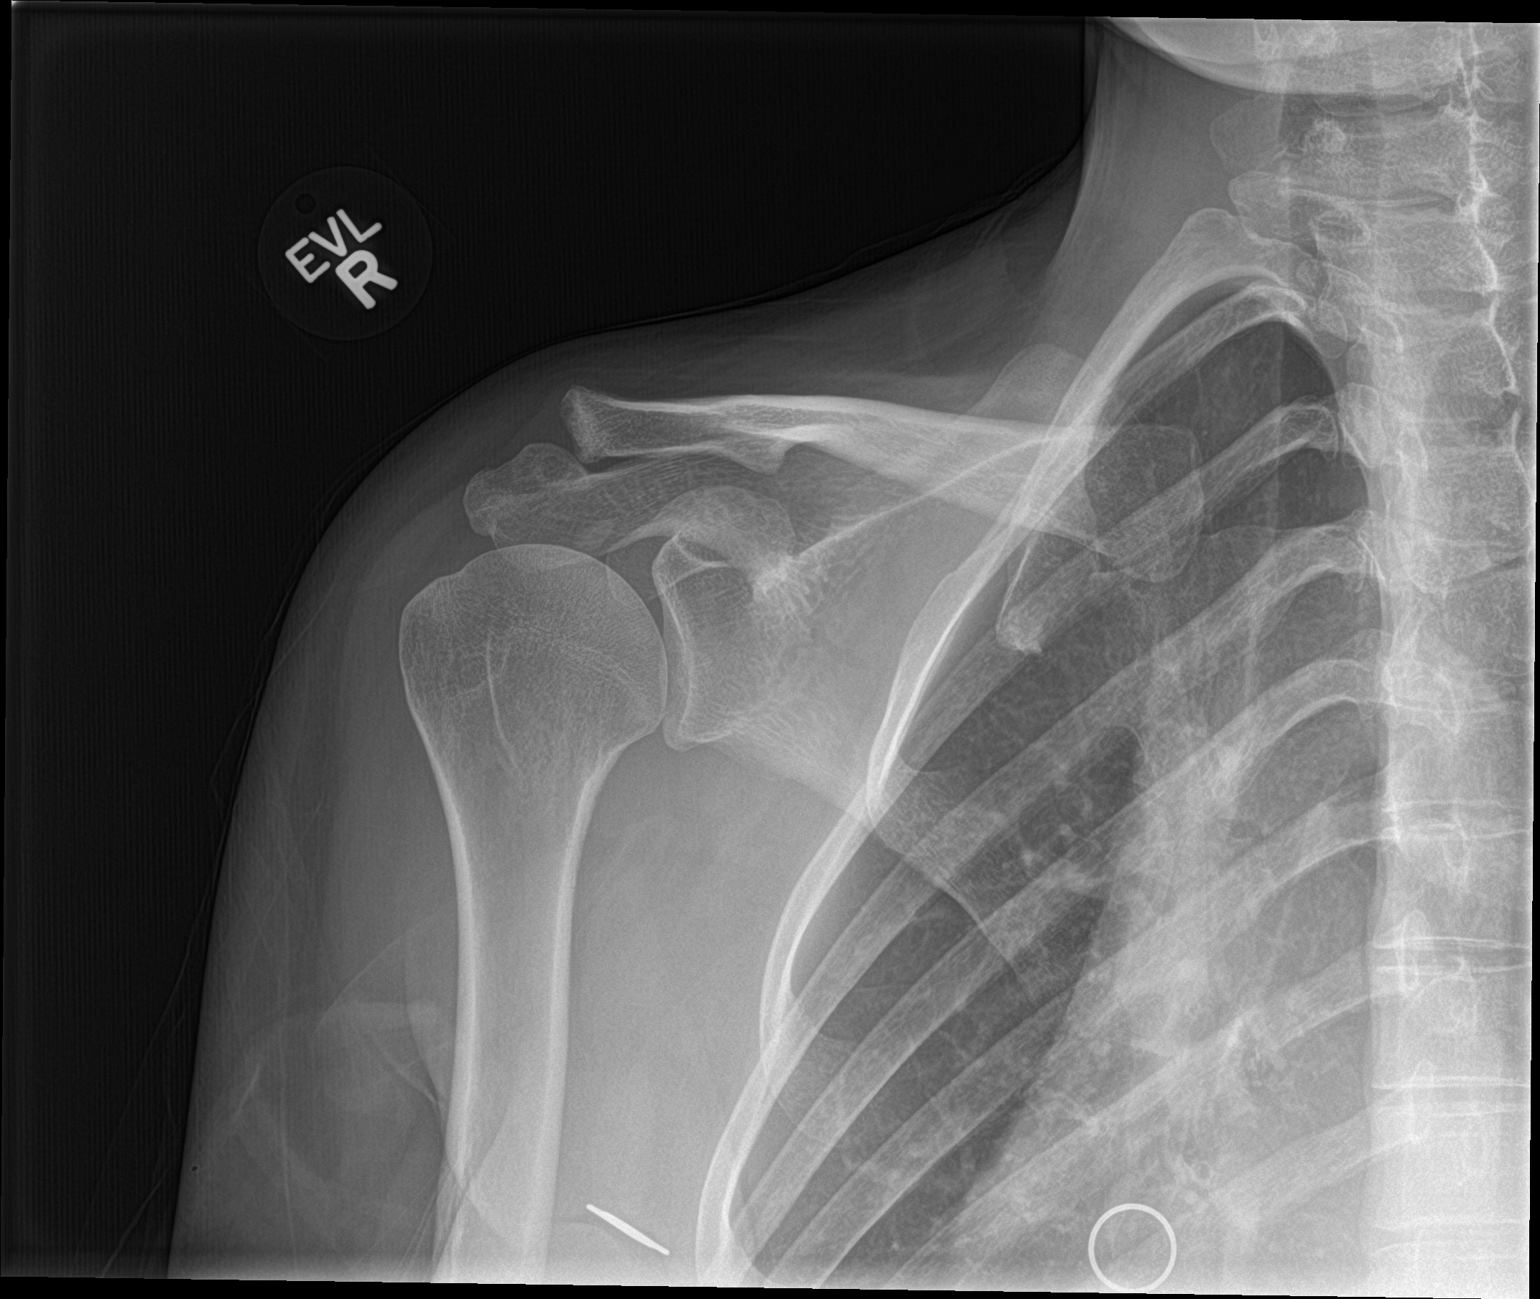

[shoulder y view]
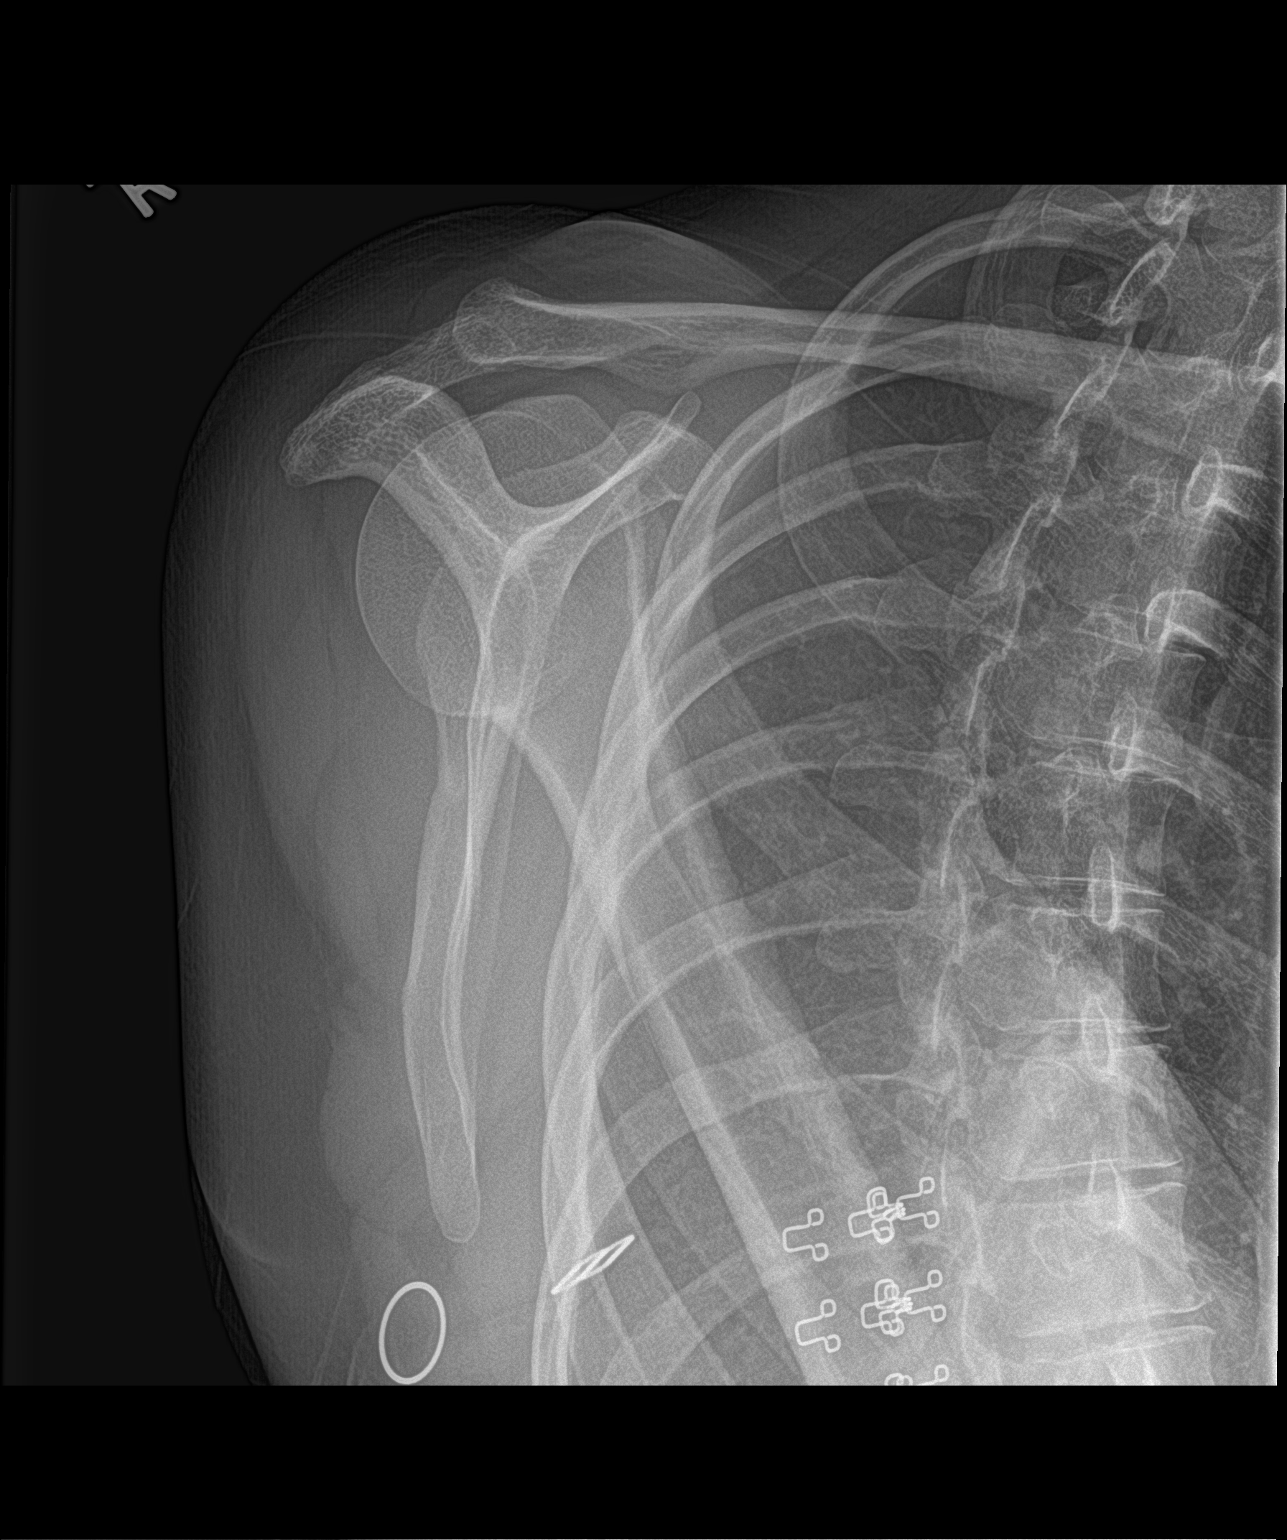

[shoulder axillary]
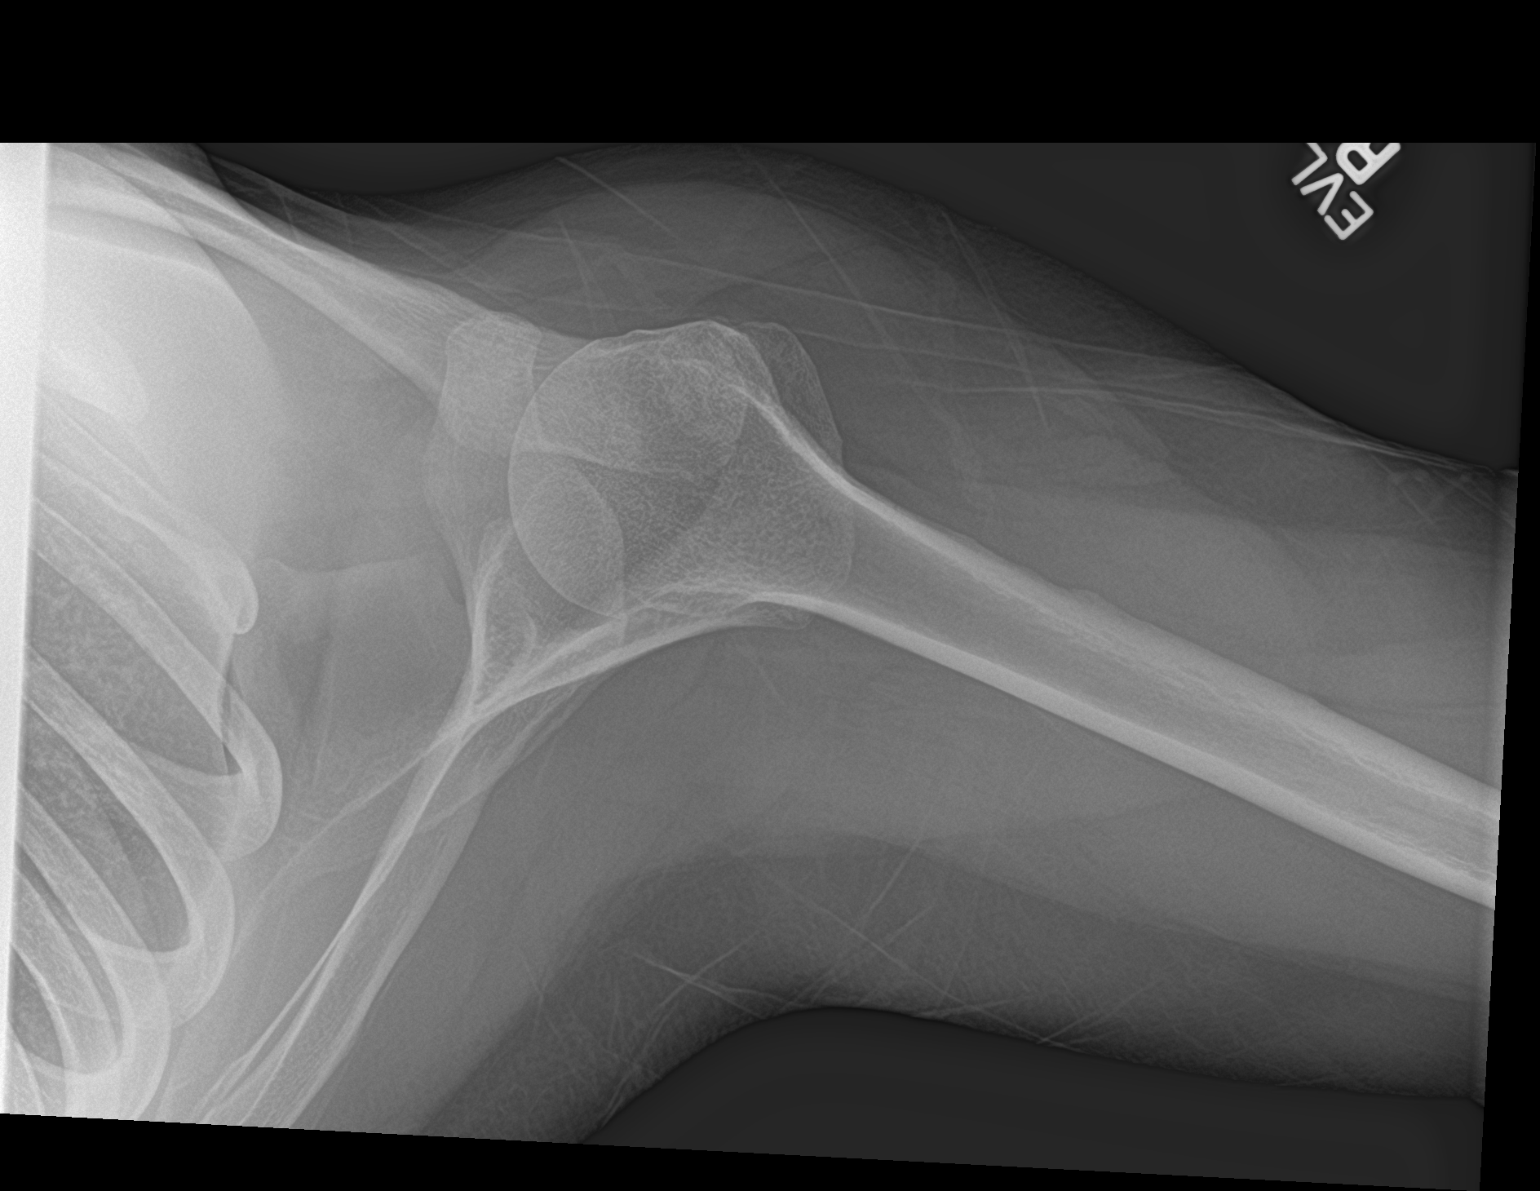

[3 of 3 positions shown; findings below may reference images not displayed]

FINDINGS: There is no evidence of fracture or dislocation. There is no
evidence of arthropathy or other focal bone abnormality. Soft
tissues are unremarkable.
IMPRESSION: No acute abnormality noted.

## 2016-09-05 ENCOUNTER — Encounter: Payer: Self-pay | Admitting: Physician Assistant

## 2016-09-05 ENCOUNTER — Ambulatory Visit (INDEPENDENT_AMBULATORY_CARE_PROVIDER_SITE_OTHER): Payer: 59 | Admitting: Physician Assistant

## 2016-09-05 VITALS — BP 121/67 | HR 86 | Ht 62.0 in | Wt 147.0 lb

## 2016-09-05 DIAGNOSIS — N3281 Overactive bladder: Secondary | ICD-10-CM

## 2016-09-05 DIAGNOSIS — L821 Other seborrheic keratosis: Secondary | ICD-10-CM

## 2016-09-05 DIAGNOSIS — R5383 Other fatigue: Secondary | ICD-10-CM | POA: Diagnosis not present

## 2016-09-05 MED ORDER — MIRABEGRON ER 50 MG PO TB24: 50.0000 mg | ORAL_TABLET | Freq: Every day | ORAL | 2 refills | Status: DC

## 2016-09-05 NOTE — Progress Notes (Addendum)
Subjective:     Patient ID: Lauren Wade, female   DOB: 1981/10/03, 35 y.o.   MRN: [id number]  HPI The patient is a 35 y.o. Caucasian female presenting today with complaints of an abnormal skin lesion on her face. The patient notes that she has always had freckles on her face but over the past severe years it has gotten bigger and darker in color. She notes that the area used to be flat but it is now raised and she can see it out of the corner of her eye. She admits to having a family history of skin cancer and not regularly wearing sunscreen outside although she tries to cover her face with a hat.   Additionally, the patient notes that she has had increased fatigue and sleeping periods with occasional dizzy spells on bending over. The patient notes that she has numerous small children which always keep her busy. She states that she is sleeping "harder" and longer than she usually does. The patient denies recent illness but notes that she is also having increase urgency and urinary incontinence. The patient denies fever, chest pain, palpitations, shortness of breath, flank pain, dysuria, or trouble emptying her bladder.  Review of Systems  Constitutional: Positive for fatigue. Negative for activity change, appetite change, chills, diaphoresis, fever and unexpected weight change.  HENT: Negative.   Eyes: Negative.   Respiratory: Negative for cough, chest tightness, shortness of breath and wheezing.   Cardiovascular: Negative for chest pain, palpitations and leg swelling.  Gastrointestinal: Negative.   Endocrine: Negative.   Genitourinary: Positive for frequency and urgency. Negative for decreased urine volume, difficulty urinating, dysuria, flank pain, hematuria, vaginal bleeding, vaginal discharge and vaginal pain.  Musculoskeletal: Negative.   Skin: Positive for color change. Negative for pallor, rash and wound.  Allergic/Immunologic: Negative.   Neurological: Negative for dizziness,  syncope, weakness, light-headedness, numbness and headaches.  Psychiatric/Behavioral: Negative.       Objective:   Physical Exam  Constitutional: She is oriented to person, place, and time. She appears well-developed and well-nourished. No distress.  HENT:  Head: Normocephalic and atraumatic.  Right Ear: External ear normal.  Left Ear: External ear normal.  Nose: Nose normal.  Mouth/Throat: Oropharynx is clear and moist. No oropharyngeal exudate.  Eyes: Conjunctivae and EOM are normal. Pupils are equal, round, and reactive to light. Right eye exhibits no discharge. Left eye exhibits no discharge. No scleral icterus.  Neck: Normal range of motion. Neck supple. No JVD present. No tracheal deviation present. No thyromegaly present.  Cardiovascular: Normal rate, regular rhythm and intact distal pulses.  Exam reveals no gallop and no friction rub.   No murmur heard. Pulmonary/Chest: Effort normal and breath sounds normal. No stridor. No respiratory distress. She has no wheezes. She has no rales. She exhibits no tenderness.  Abdominal: Soft. Bowel sounds are normal. She exhibits no distension and no mass. There is no tenderness. There is no rebound and no guarding.  Lymphadenopathy:    She has no cervical adenopathy.  Neurological: She is alert and oriented to person, place, and time. No cranial nerve deficit. Coordination normal.  Skin: Skin is warm and dry. No rash noted. She is not diaphoretic. No erythema. No pallor.     Psychiatric: She has a normal mood and affect. Her behavior is normal. Judgment and thought content normal.      Assessment:     Diagnoses and all orders for this visit:  No energy -     CBC -  Comprehensive metabolic panel -     C-reactive protein -     Ferritin -     Sedimentation rate -     TSH -     Vitamin B12 -     Vit D  25 hydroxy (rtn osteoporosis monitoring)  OAB (overactive bladder) -     mirabegron ER (MYRBETRIQ) 50 MG TB24 tablet; Take 1  tablet (50 mg total) by mouth daily.  Seborrheic keratosis      Plan:     1. No energy - Discussed with patient that her lack of energy could be due to numerous etiologies. Patient to get CBC, CMP, C-reactive protein, Ferritin, Sed rate, TSH, Vitamin B12, and Vitamin D to determine potential underlying etiology. Will call patient with labwork results and determine need for further medication intervention at that time.   2. OAB - Discussed with patient that her symptoms of urgency, frequency, and incontinence are likely secondary to over active bladder. Patient to start mirabegron 50mg  once daily. Discussed kegals and pelvic floor strengthen exercises.  Patient to return-to-clinic if symptoms do not improve or worsen.   3. Seborrheic keratosis - Patient educated that her skin lesion is very likely a seborrheic keratosis. Patient was referred to dermatology for removal due to location of lesion on her face and for further evaluation. Will continue to monitor.

## 2016-09-05 NOTE — Addendum Note (Signed)
Addended by: Jomarie LongsBREEBACK, JADE L on: 09/05/2016 05:20 PM   Modules accepted: Orders

## 2016-09-05 NOTE — Patient Instructions (Signed)
Seborrheic Keratosis Seborrheic keratosis is a common, noncancerous (benign) skin growth. This condition causes waxy, rough, tan, brown, or black spots to appear on the skin. These skin growths can be flat or raised. CAUSES The cause of this condition is not known. RISK FACTORS This condition is more likely to develop in:  People who have a family history of seborrheic keratosis.  People who are 50 or older.  People who are pregnant.  People who have had estrogen replacement therapy. SYMPTOMS This condition often occurs on the face, chest, shoulders, back, or other areas. These growths:  Are usually painless, but may become irritated and itchy.  Can be yellow, brown, black, or other colors.  Are slightly raised or have a flat surface.  Are sometimes rough or wart-like in texture.  Are often waxy on the surface.  Are round or oval-shaped.  Sometimes look like they are "stuck on."  Often occur in groups, but may occur as a single growth. DIAGNOSIS This condition is diagnosed with a medical history and physical exam. A sample of the growth may be tested (skin biopsy). You may need to see a skin specialist (dermatologist). TREATMENT Treatment is not usually needed for this condition, unless the growths are irritated or are often bleeding. You may also choose to have the growths removed if you do not like their appearance. Most commonly, these growths are treated with a procedure in which liquid nitrogen is applied to "freeze" off the growth (cryosurgery). They may also be burned off with electricity or cut off. HOME CARE INSTRUCTIONS  Watch your growth for any changes.  Keep all follow-up visits as told by your health care provider. This is important.  Do not scratch or pick at the growth or growths. This can cause them to become irritated or infected. SEEK MEDICAL CARE IF:  You suddenly have many new growths.  Your growth bleeds, itches, or hurts.  Your growth suddenly  becomes larger or changes color.   This information is not intended to replace advice given to you by your health care provider. Make sure you discuss any questions you have with your health care provider.   Document Released: 12/22/2010 Document Revised: 08/10/2015 Document Reviewed: 04/06/2015 Elsevier Interactive Patient Education 2016 [employer]  

## 2016-09-06 LAB — COMPREHENSIVE METABOLIC PANEL
ALK PHOS: 62 U/L (ref 33–115)
ALT: 8 U/L (ref 6–29)
AST: 14 U/L (ref 10–30)
Albumin: 4.5 g/dL (ref 3.6–5.1)
BUN: 12 mg/dL (ref 7–25)
CO2: 25 mmol/L (ref 20–31)
Calcium: 9.8 mg/dL (ref 8.6–10.2)
Chloride: 104 mmol/L (ref 98–110)
Creat: 1.04 mg/dL (ref 0.50–1.10)
GLUCOSE: 81 mg/dL (ref 65–99)
POTASSIUM: 4 mmol/L (ref 3.5–5.3)
SODIUM: 139 mmol/L (ref 135–146)
Total Bilirubin: 0.5 mg/dL (ref 0.2–1.2)
Total Protein: 7.2 g/dL (ref 6.1–8.1)

## 2016-09-06 LAB — CBC
HCT: 42.4 % (ref 35.0–45.0)
Hemoglobin: 14.1 g/dL (ref 11.7–15.5)
MCH: 30.2 pg (ref 27.0–33.0)
MCHC: 33.3 g/dL (ref 32.0–36.0)
MCV: 90.8 fL (ref 80.0–100.0)
MPV: 11.2 fL (ref 7.5–12.5)
PLATELETS: 288 10*3/uL (ref 140–400)
RBC: 4.67 MIL/uL (ref 3.80–5.10)
RDW: 13.1 % (ref 11.0–15.0)
WBC: 8.3 10*3/uL (ref 3.8–10.8)

## 2016-09-06 LAB — VITAMIN D 25 HYDROXY (VIT D DEFICIENCY, FRACTURES): Vit D, 25-Hydroxy: 28 ng/mL — ABNORMAL LOW (ref 30–100)

## 2016-09-06 LAB — TSH: TSH: 0.38 m[IU]/L — AB

## 2016-09-06 LAB — VITAMIN B12: Vitamin B-12: 1715 pg/mL — ABNORMAL HIGH (ref 200–1100)

## 2016-09-06 LAB — SEDIMENTATION RATE: Sed Rate: 5 mm/hr (ref 0–20)

## 2016-09-06 LAB — FERRITIN: Ferritin: 51 ng/mL (ref 10–154)

## 2016-09-06 LAB — C-REACTIVE PROTEIN: CRP: 0.9 mg/L (ref <8.0)

## 2016-09-10 ENCOUNTER — Encounter: Payer: Self-pay | Admitting: Family Medicine

## 2016-09-10 ENCOUNTER — Ambulatory Visit (INDEPENDENT_AMBULATORY_CARE_PROVIDER_SITE_OTHER): Payer: 59 | Admitting: Family Medicine

## 2016-09-10 VITALS — BP 123/81 | HR 86 | Ht 62.0 in | Wt 147.0 lb

## 2016-09-10 DIAGNOSIS — F411 Generalized anxiety disorder: Secondary | ICD-10-CM

## 2016-09-10 DIAGNOSIS — R0789 Other chest pain: Secondary | ICD-10-CM | POA: Diagnosis not present

## 2016-09-10 DIAGNOSIS — R946 Abnormal results of thyroid function studies: Secondary | ICD-10-CM | POA: Diagnosis not present

## 2016-09-10 DIAGNOSIS — R7989 Other specified abnormal findings of blood chemistry: Secondary | ICD-10-CM

## 2016-09-10 DIAGNOSIS — R0602 Shortness of breath: Secondary | ICD-10-CM | POA: Diagnosis not present

## 2016-09-10 NOTE — Progress Notes (Signed)
Subjective:    CC: chest pain  HPI:  35 yo Female with prior hx of GAD  Comes in today for chest pain. She was seen about 5 days ago for fatigue and some occasional dizzy spells. She felt like she was sleeping a lot more than usual at the time. He was not having chest pain at that time. She did do some extensive blood work including a vitamin D, B12, thyroid, inflammatory markers including sedimentation rate and CRP, ferritin, CMP and CBC. Results revealed some mild vitamin D deficiency and she was started on supplement as well as elevated B12 levels. She was encouraged to lower any B12 supplementation and recheck in 2 months. TSH was also little borderline low at 0.38. Over the weekend she started to feel worse with some heaviness in the extremities and some chest pain.She has felt a little bit more short of breath with activities. She's noticed some tingling in her hands and around her mouth area intermittently. And felt lightheaded at times but usually if she bends over and then stands up quickly. She's not currently on any type of birth control etc. She also reports that she's been very stressed the last month. It's been difficult to concentrate and complete tasks in a timely manner. She has a prior history of anxiety is not currently on any medications.  Past medical history, Surgical history, Family history not pertinant except as noted below, Social history, Allergies, and medications have been entered into the medical record, reviewed, and corrections made.   Review of Systems: No fevers, chills, night sweats, weight loss, chest pain, or shortness of breath.   Objective:    General: Well Developed, well nourished, and in no acute distress.  Neuro: Alert and oriented x3, extra-ocular muscles intact, sensation grossly intact.  HEENT: Normocephalic, atraumatic  Skin: Warm and dry, no rashes. Cardiac: Regular rate and rhythm, no murmurs rubs or gallops, no lower extremity edema.  Respiratory:  Clear to auscultation bilaterally. Not using accessory muscles, speaking in full sentences.   Impression and Recommendations:   Atypical chest pain- EKG shows Normal sinus rhythm, rate of 90 bpm with no acute ST-T wave changes. She does have inverted T waves in lead 3. Will check a d-dimer and recheck thyroid level.  Fatigue- unclear etiology at this point. She had fairly extensive labs were overall fairly normal. TSH was a little borderline cirrhotic recheck that today and add a free T4.  GAD- GAD - 7 score 16 and PHQ - 9 score of 13. Her anxiety symptoms are severe and her depression symptoms are moderate. She rates her symptoms as very difficult. We discussed options including therapy/counseling versus medication. She tried Lexapro previously and it made her extremely nauseated and she had tried let Zoloft in the past and it causes muscle twitching and tremor. She said that she and J had actually spoke about possibly using Wellbutrin. I encouraged her to think about it between now when we call back with her lab results.

## 2016-09-11 LAB — TSH: TSH: 0.46 mIU/L

## 2016-09-11 LAB — T4, FREE: Free T4: 1.3 ng/dL (ref 0.8–1.8)

## 2016-09-11 LAB — D-DIMER, QUANTITATIVE: D-Dimer, Quant: 0.27 mcg/mL FEU (ref <0.50)

## 2016-09-11 MED ORDER — BUPROPION HCL ER (XL) 150 MG PO TB24: 150.0000 mg | ORAL_TABLET | ORAL | 1 refills | Status: DC

## 2016-09-11 NOTE — Addendum Note (Signed)
Addended by: Nani GasserMETHENEY, Avelino Herren D on: 09/11/2016 05:06 PM   Modules accepted: Orders

## 2017-02-26 IMAGING — US US ABDOMEN COMPLETE
1 series · 13 of 25 positions shown · non-contrast
Comparison: Abdominal ultrasound December 19, 2012

CLINICAL DATA: Right upper quadrant abdominal pain for 1 month with
nausea and constipation; history of irritable bowel disease.

EXAM:
ULTRASOUND ABDOMEN COMPLETE

[Series 1: us abdomen complete · 0.14mm/px · 13 of 164 slices shown]
[im 1/164]
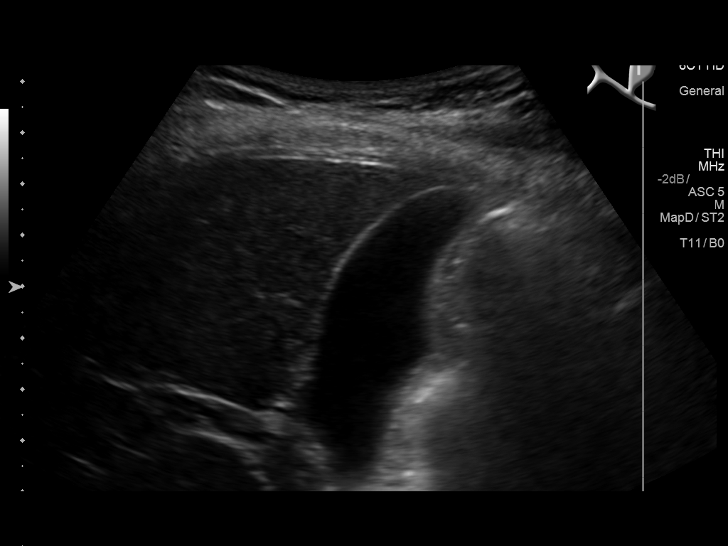
[im 14/164]
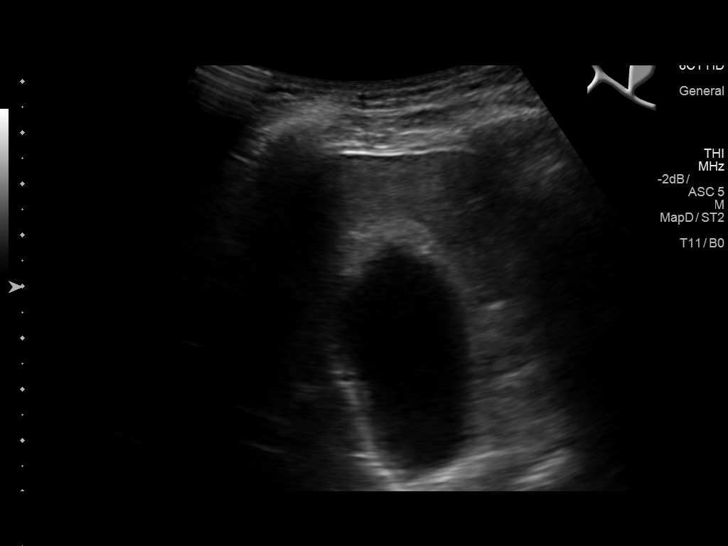
[im 28/164]
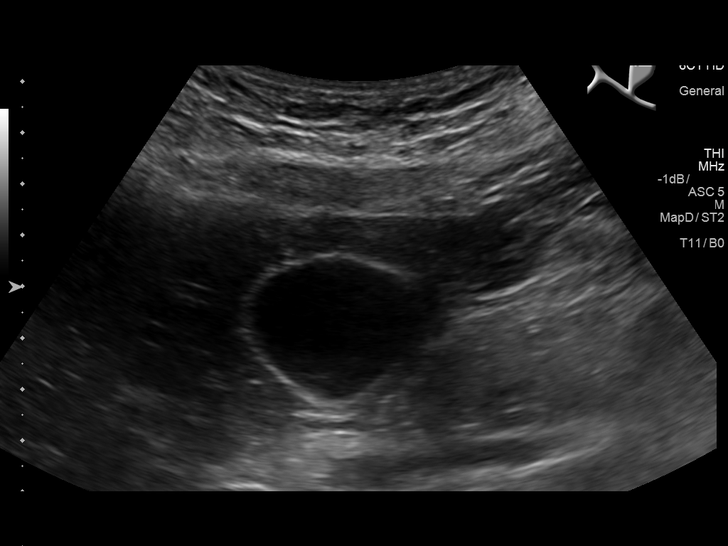
[im 41/164]
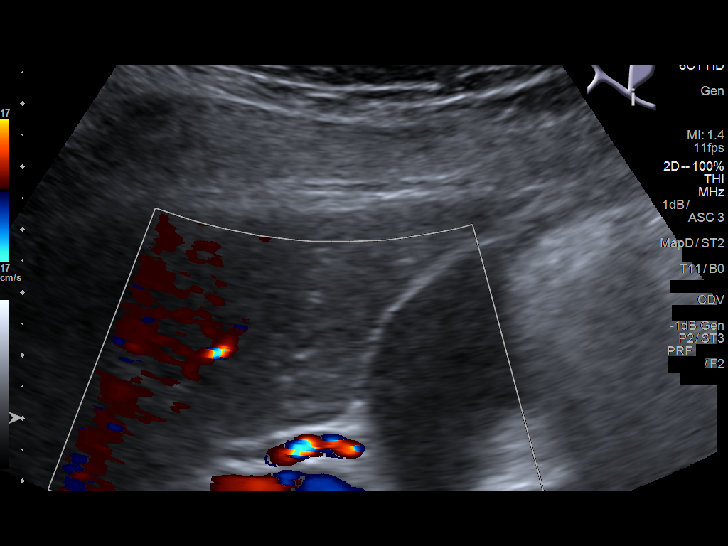
[im 55/164]
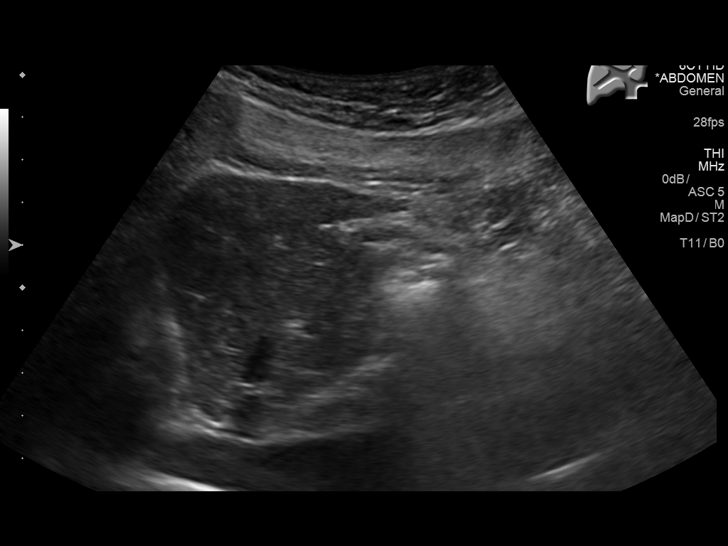
[im 68/164]
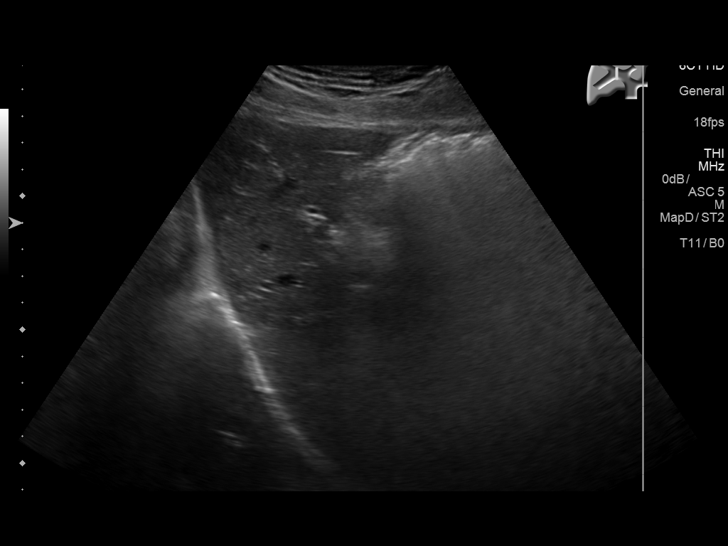
[im 82/164]
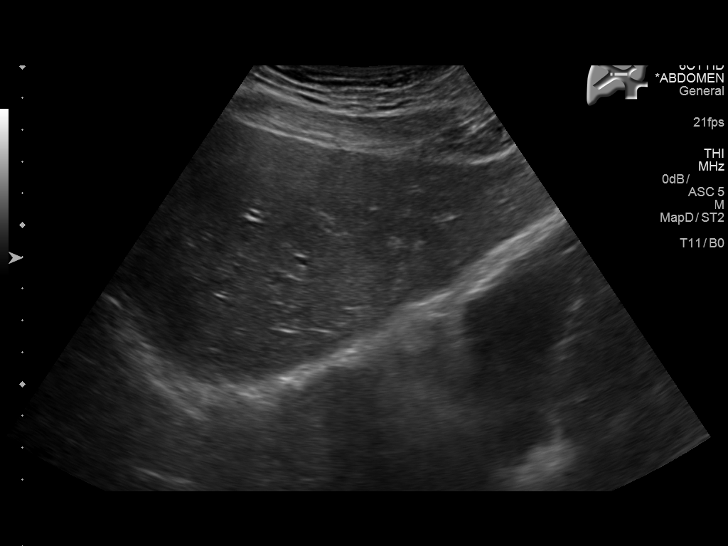
[im 96/164]
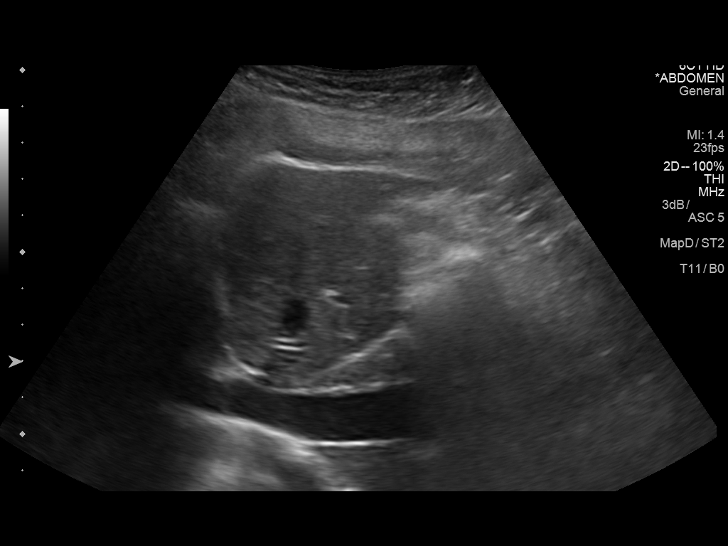
[im 109/164]
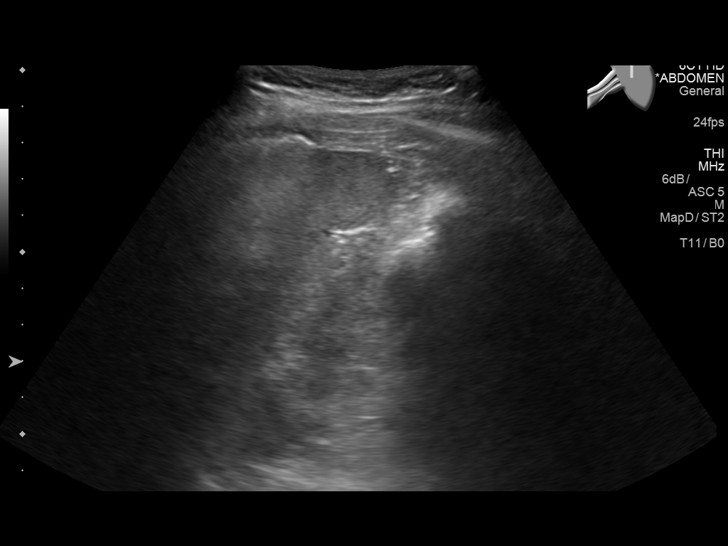
[im 123/164]
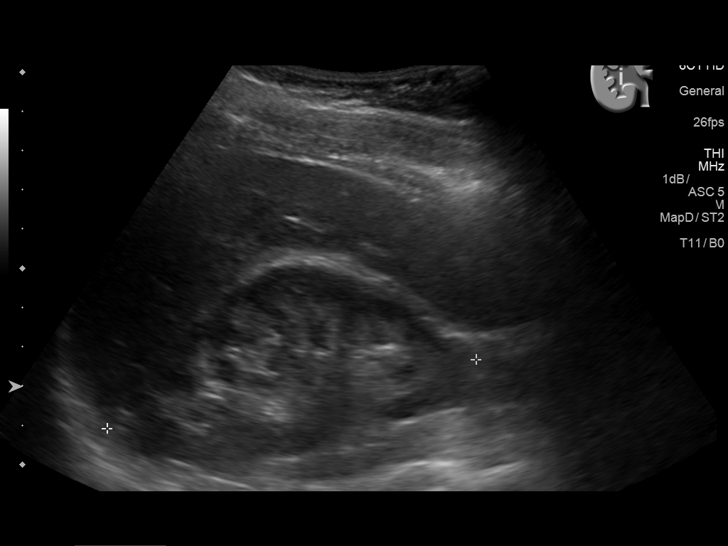
[im 136/164]
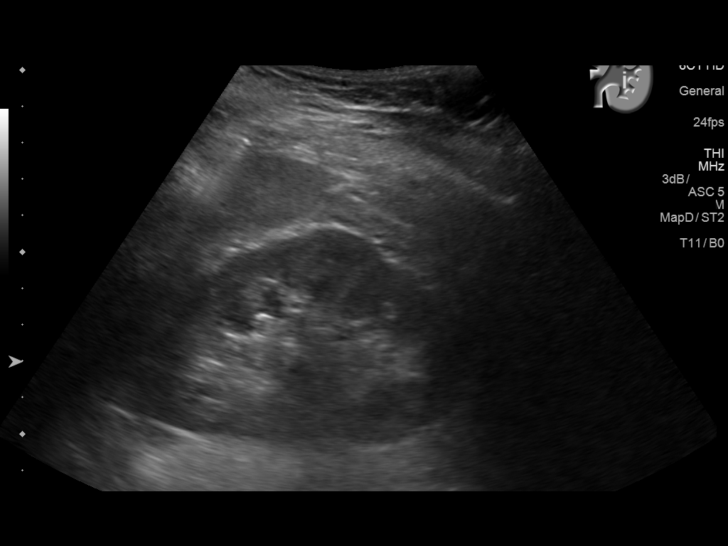
[im 150/164]
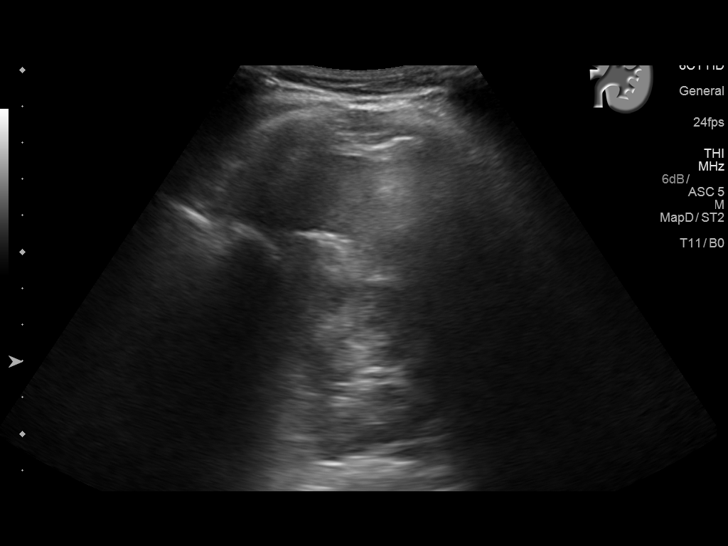
[im 164/164]
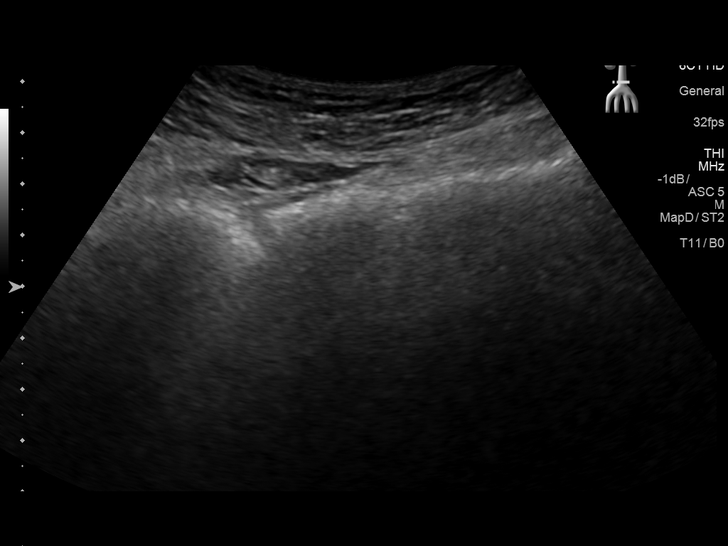

[13 of 25 positions shown; findings below may reference images not displayed]

FINDINGS: Gallbladder: The gallbladder is adequately distended. No stones are
evident. There is no gallbladder wall thickening, pericholecystic
fluid, or positive sonographic Murphy's sign.

Common bile duct: Diameter: The common bile duct measures 5 mm in
diameter. There are intraluminal echoes observed.

Liver: The liver exhibits normal echotexture with no focal mass nor
ductal dilation.

IVC: Evaluation of the IVC was limited by bowel gas.

Pancreas: The pancreatic head and tail were largely obscured by
bowel gas. The pancreatic body is normal where visualized.

Spleen: Size and appearance within normal limits.

Right Kidney: Length: 9.6 cm. Echogenicity within normal limits. No
mass or hydronephrosis visualized.

Left Kidney: Length: 10.5 cm. Echogenicity within normal limits. No
mass or hydronephrosis visualized.

Abdominal aorta: Evaluation of the abdominal aorta was limited by
bowel gas.

Other findings: No ascites is demonstrated.
IMPRESSION: 1. No gallstones are evident. The common bile duct is within the
limits of normal in caliber but it exhibits some internal echoes
which may reflect tiny stones. There is no intrahepatic ductal
dilation. MRCP may be useful.
2. There is no acute abnormality demonstrated elsewhere within the
abdomen.

## 2018-02-10 ENCOUNTER — Ambulatory Visit (INDEPENDENT_AMBULATORY_CARE_PROVIDER_SITE_OTHER): Payer: BLUE CROSS/BLUE SHIELD | Admitting: Physician Assistant

## 2018-02-10 ENCOUNTER — Encounter: Payer: Self-pay | Admitting: Physician Assistant

## 2018-02-10 VITALS — BP 134/85 | HR 78 | Ht 62.0 in | Wt 179.0 lb

## 2018-02-10 DIAGNOSIS — K5904 Chronic idiopathic constipation: Secondary | ICD-10-CM

## 2018-02-10 DIAGNOSIS — N816 Rectocele: Secondary | ICD-10-CM

## 2018-02-10 DIAGNOSIS — R1013 Epigastric pain: Secondary | ICD-10-CM | POA: Diagnosis not present

## 2018-02-10 DIAGNOSIS — K921 Melena: Secondary | ICD-10-CM | POA: Diagnosis not present

## 2018-02-10 MED ORDER — OMEPRAZOLE 40 MG PO CPDR: 40.0000 mg | DELAYED_RELEASE_CAPSULE | Freq: Every day | ORAL | 3 refills | Status: DC

## 2018-02-10 MED ORDER — POLYETHYLENE GLYCOL 3350 17 GM/SCOOP PO POWD: 17.0000 g | Freq: Two times a day (BID) | ORAL | 1 refills | Status: DC | PRN

## 2018-02-10 NOTE — Progress Notes (Signed)
Subjective:    Patient ID: Lauren Wade, female    DOB: 05-19-1981, 37 y.o.   MRN: [id number]  HPI  Patient is a 37 yo female who presents to the clinic with black tarry stools, epigastric pain, constipation and vaginal bulge.   She admits she is not eating like she should. Pain seems to be worse with foods like pizza. She burps a lot. She also takes a lot of BC powder. She was being better with diet with limiting fats, no sweets or fried foods but recently she has been eating anything she wants. She is having a lot of constipation. She is not taking any medication for it.   She was concerned las week because she felt a vaginal bulge. No pain but some pressure. No dysuria or changes in urinary symptoms. It has seemed to go away.   .. Active Ambulatory Problems    Diagnosis Date Noted  . GAD (generalized anxiety disorder) 01/07/2015  . Cervical dysplasia 01/07/2015  . Abnormal weight gain 01/07/2015  . Obese 01/07/2015  . Right shoulder pain 01/11/2015  . Rectocele 02/11/2018  . Epigastric pain 02/11/2018  . Black stools 02/11/2018   Resolved Ambulatory Problems    Diagnosis Date Noted  . BRONCHITIS 01/21/2010  . RUQ pain 05/05/2015   Past Medical History:  Diagnosis Date  . IBS (irritable bowel syndrome)      Review of Systems  All other systems reviewed and are negative.      Objective:   Physical Exam  Constitutional: She is oriented to person, place, and time. She appears well-developed and well-nourished.  HENT:  Head: Normocephalic and atraumatic.  Neck: Normal range of motion. Neck supple.  Cardiovascular: Normal rate, regular rhythm and normal heart sounds.  Pulmonary/Chest: Effort normal and breath sounds normal.  Abdominal: Soft. She exhibits no distension and no mass. There is tenderness. There is no rebound and no guarding.  Epigastric tenderness without guarding or rebound.  difuse tenderness over LLQ.  Negative psoas sign.   Genitourinary: Vagina  normal.  Genitourinary Comments: No vaginal bulge seen or palpated. No tenderness on exam.   Lymphadenopathy:    She has no cervical adenopathy.  Neurological: She is alert and oriented to person, place, and time.  Psychiatric: She has a normal mood and affect. Her behavior is normal.          Assessment & Plan:  Marland Kitchen.Marland Kitchen.Diagnoses and all orders for this visit:  Epigastric pain -     CBC with Differential/Platelet -     COMPLETE METABOLIC PANEL WITH GFR -     Lipase -     US Abdomen Complete -     omeprazole (PRILOSEC) 40 MG capsule; Take 1 capsule (40 mg total) by mouth daily.  Black stools -     CBC with Differential/Platelet -     COMPLETE METABOLIC PANEL WITH GFR -     Lipase -     US Abdomen Complete -     omeprazole (PRILOSEC) 40 MG capsule; Take 1 capsule (40 mg total) by mouth daily.  Rectocele  Chronic idiopathic constipation -     polyethylene glycol powder (GLYCOLAX/MIRALAX) powder; Take 17 g by mouth 2 (two) times daily as needed.   Return tomorrow for h.pylori testing. Before starting omeprazole I want to make sure test is negative. GI cocktail to be given after testing. Symptoms consistent with PUD.STOP BC powders.  Cannot completely rule out early gallbladder disease. Will get US.  Will check  STAT labs to confirm to acute blood loss, pancreatitis, and acute organ damage. Discussed GERD diet.  Follow up in 4 weeks.   I suspect that patient had rectocele that flared due to constipation. Given miralax for constipation. Discussed food choices. Encouraged probiotic. Will continue to monitor. Vaginal exam today was normal.

## 2018-02-11 ENCOUNTER — Ambulatory Visit: Payer: BLUE CROSS/BLUE SHIELD | Admitting: Physician Assistant

## 2018-02-11 ENCOUNTER — Encounter: Payer: Self-pay | Admitting: Physician Assistant

## 2018-02-11 VITALS — BP 124/66 | HR 88 | Temp 98.6°F | Resp 16 | Wt 181.3 lb

## 2018-02-11 DIAGNOSIS — N816 Rectocele: Secondary | ICD-10-CM | POA: Insufficient documentation

## 2018-02-11 DIAGNOSIS — R1013 Epigastric pain: Secondary | ICD-10-CM | POA: Insufficient documentation

## 2018-02-11 DIAGNOSIS — K921 Melena: Secondary | ICD-10-CM | POA: Diagnosis not present

## 2018-02-11 MED ORDER — GI COCKTAIL ~~LOC~~
30.0000 mL | Freq: Once | ORAL | Status: AC
  Administered 2018-02-11: 30 mL via ORAL

## 2018-02-11 NOTE — Progress Notes (Signed)
HPI: Patient is here for H. Pylori Breath test and GI Cocktail for her epigastric pain. Patient denies chest pain, shortness of breath.   Assessment and Plan: Administered H. Pylori Breath test and GI Cocktail without and complications.  Agree with above plan. Tandy GawJade Breeback PA-C

## 2018-02-12 LAB — CBC WITH DIFFERENTIAL/PLATELET
Basophils Absolute: 24 cells/uL (ref 0–200)
Basophils Relative: 0.3 %
Eosinophils Absolute: 88 cells/uL (ref 15–500)
Eosinophils Relative: 1.1 %
HCT: 38.1 % (ref 35.0–45.0)
HEMOGLOBIN: 13.2 g/dL (ref 11.7–15.5)
Lymphs Abs: 2608 cells/uL (ref 850–3900)
MCH: 30.3 pg (ref 27.0–33.0)
MCHC: 34.6 g/dL (ref 32.0–36.0)
MCV: 87.6 fL (ref 80.0–100.0)
MONOS PCT: 6.5 %
MPV: 11.1 fL (ref 7.5–12.5)
NEUTROS ABS: 4760 {cells}/uL (ref 1500–7800)
Neutrophils Relative %: 59.5 %
PLATELETS: 307 10*3/uL (ref 140–400)
RBC: 4.35 10*6/uL (ref 3.80–5.10)
RDW: 11.8 % (ref 11.0–15.0)
TOTAL LYMPHOCYTE: 32.6 %
WBC mixed population: 520 cells/uL (ref 200–950)
WBC: 8 10*3/uL (ref 3.8–10.8)

## 2018-02-12 LAB — COMPLETE METABOLIC PANEL WITH GFR
AG Ratio: 1.6 (calc) (ref 1.0–2.5)
ALBUMIN MSPROF: 4.1 g/dL (ref 3.6–5.1)
ALKALINE PHOSPHATASE (APISO): 65 U/L (ref 33–115)
ALT: 10 U/L (ref 6–29)
AST: 16 U/L (ref 10–30)
BILIRUBIN TOTAL: 0.5 mg/dL (ref 0.2–1.2)
BUN: 10 mg/dL (ref 7–25)
CHLORIDE: 106 mmol/L (ref 98–110)
CO2: 27 mmol/L (ref 20–32)
Calcium: 9.7 mg/dL (ref 8.6–10.2)
Creat: 0.82 mg/dL (ref 0.50–1.10)
GFR, Est African American: 107 mL/min/{1.73_m2} (ref >=60)
GFR, Est Non African American: 92 mL/min/{1.73_m2} (ref >=60)
GLUCOSE: 84 mg/dL (ref 65–99)
Globulin: 2.5 g/dL (calc) (ref 1.9–3.7)
Potassium: 4.2 mmol/L (ref 3.5–5.3)
Sodium: 140 mmol/L (ref 135–146)
Total Protein: 6.6 g/dL (ref 6.1–8.1)

## 2018-02-12 LAB — LIPASE: LIPASE: 13 U/L (ref 7–60)

## 2018-02-12 LAB — H. PYLORI BREATH TEST: H. PYLORI BREATH TEST: NOT DETECTED

## 2018-02-12 NOTE — Progress Notes (Signed)
Call pt: negative H.pylori bacteria. Did you get any improvement with GI cocktail? Did you start omeprazole yet?

## 2018-02-12 NOTE — Progress Notes (Signed)
Call pt: no anemia. Pancreatic enzymes look great. Kidney, liver, glucose wonderful. Labs great.

## 2018-02-14 DIAGNOSIS — K5904 Chronic idiopathic constipation: Secondary | ICD-10-CM | POA: Insufficient documentation

## 2018-02-17 ENCOUNTER — Telehealth: Payer: Self-pay | Admitting: Family Medicine

## 2018-02-17 ENCOUNTER — Ambulatory Visit (INDEPENDENT_AMBULATORY_CARE_PROVIDER_SITE_OTHER): Payer: BLUE CROSS/BLUE SHIELD

## 2018-02-17 DIAGNOSIS — N200 Calculus of kidney: Secondary | ICD-10-CM

## 2018-02-17 MED ORDER — TAMSULOSIN HCL 0.4 MG PO CAPS
0.4000 mg | ORAL_CAPSULE | Freq: Every day | ORAL | 3 refills | Status: DC
Start: 2018-02-17 — End: 2018-02-18

## 2018-02-17 NOTE — Telephone Encounter (Signed)
Kidney stone on Lauren Wade.  Flomax sent in.  Pt will schedule with me tomorrow

## 2018-02-18 ENCOUNTER — Ambulatory Visit: Payer: BLUE CROSS/BLUE SHIELD | Admitting: Family Medicine

## 2018-02-18 ENCOUNTER — Encounter: Payer: Self-pay | Admitting: Family Medicine

## 2018-02-18 VITALS — BP 109/64 | HR 75 | Ht 62.0 in | Wt 180.0 lb

## 2018-02-18 DIAGNOSIS — E559 Vitamin D deficiency, unspecified: Secondary | ICD-10-CM | POA: Diagnosis not present

## 2018-02-18 DIAGNOSIS — R1011 Right upper quadrant pain: Secondary | ICD-10-CM | POA: Diagnosis not present

## 2018-02-18 DIAGNOSIS — R5383 Other fatigue: Secondary | ICD-10-CM | POA: Diagnosis not present

## 2018-02-18 DIAGNOSIS — E6609 Other obesity due to excess calories: Secondary | ICD-10-CM

## 2018-02-18 DIAGNOSIS — R7989 Other specified abnormal findings of blood chemistry: Secondary | ICD-10-CM | POA: Diagnosis not present

## 2018-02-18 DIAGNOSIS — Z6832 Body mass index (BMI) 32.0-32.9, adult: Secondary | ICD-10-CM | POA: Diagnosis not present

## 2018-02-18 NOTE — Progress Notes (Signed)
Follow up with patient. I do not think pain is likely from kidney stone since non-obstructing and still in kidney. Keep me up dated on symptoms.

## 2018-02-18 NOTE — Progress Notes (Signed)
Lauren Wade is a 37 y.o. female who presents to [facility] today for right upper quadrant abdominal pain.  Lauren Wade has been dealing with right upper quadrant abdominal pain for some time now.  She had an initial laboratory workup which was unremarkable.  She had an abdominal ultrasound which showed a normal gallbladder and liver and bile duct and pancreas.  She did however have a 4 mm stone in the lower pole of the right kidney.  She notes her pain is present when she eats.  She notes that she has had kidney stones but this does not feel like a kidney stone at all.  She denies any flank pain radiating to her groin.  She notes that she does take Goody powders for headaches.  Additionally she notes a pertinent medical history for migraine.  As part of her workup she had a negative H. pylori breath test.  Currently is taking omeprazole.   Additionally Lauren Wade discussed her difficulties losing weight.  Notes that she has not been eating much over the last 2 weeks due to her abdominal pain and she is not losing weight.  She has struggled to lose weight in the past.  She does not track her calories.  Past Medical History:  Diagnosis Date  . IBS (irritable bowel syndrome)    Past Surgical History:  Procedure Laterality Date  . removal of cervical CA     Social History   Tobacco Use  . Smoking status: Former Games developer  . Smokeless tobacco: Never Used  Substance Use Topics  . Alcohol use: Yes   family history includes Depression in her mother and paternal aunt; Hyperlipidemia in her father.  ROS as above:  Medications: Current Outpatient Medications  Medication Sig Dispense Refill  . omeprazole (PRILOSEC) 40 MG capsule Take 1 capsule (40 mg total) by mouth daily. 30 capsule 3  . polyethylene glycol powder (GLYCOLAX/MIRALAX) powder Take 17 g by mouth 2 (two) times daily as  needed. 3350 g 1   No current facility-administered medications for this visit.    Allergies  Allergen Reactions  . Paxil [Paroxetine Hcl] Nausea And Vomiting  . Zoloft [Sertraline Hcl] Other (See Comments)    Body shakes    Health Maintenance Health Maintenance  Topic Date Due  . HIV Screening  01/23/1997  . PAP SMEAR  11/02/2016  . INFLUENZA VACCINE  07/03/2017  . TETANUS/TDAP  07/03/2022     Exam:  BP 109/64   Pulse 75   Ht 5\' 2"  (1.575 m)   Wt 180 lb (81.6 kg)   BMI 32.92 kg/m  Gen: Well NAD HEENT: EOMI,  MMM Lungs: Normal work of breathing. CTABL Heart: RRR no MRG Abd: NABS, Soft. Nondistended, Nontender no rebound or guarding Exts: Brisk capillary refill, warm and well perfused.    No results found for this or any previous visit (from the past 72 hour(s)). US Abdomen Complete  Result Date: 02/17/2018 CLINICAL DATA:  Epigastric and right flank region pain EXAM: ABDOMEN ULTRASOUND COMPLETE COMPARISON:  Abdominal MRI May 16, 2015 and abdominal ultrasound May 09, 2015 FINDINGS: Gallbladder: No gallstones or wall thickening visualized. There is no pericholecystic fluid. No sonographic Murphy sign noted by sonographer. Common bile duct: Diameter: 3 mm. No intrahepatic, common hepatic, or common bile duct dilatation. Liver: No focal lesion identified. Within normal limits in parenchymal echogenicity. Portal vein is patent on color Doppler imaging with normal direction of blood flow  towards the liver. IVC: No abnormality visualized. Pancreas: No pancreatic mass or inflammatory focus. Spleen: Size and appearance within normal limits. Right Kidney: Length: 9.1 cm. Echogenicity within normal limits. No mass or hydronephrosis visualized. There is a 4 mm calculus in the lower pole right kidney. Left Kidney: Length: 9.6 cm. Echogenicity within normal limits. No mass or hydronephrosis visualized. Abdominal aorta: No aneurysm visualized. Other findings: No demonstrable ascites.  IMPRESSION: 4 mm nonobstructing calculus lower pole right kidney. Study otherwise unremarkable. Electronically Signed   By: Bretta BangWilliam  Woodruff III M.D.   On: 02/17/2018 13:22  I personally (independently) visualized and performed the interpretation of the images attached in this note.    Chemistry      Component Value Date/Time   NA 140 02/11/2018 0939   K 4.2 02/11/2018 0939   CL 106 02/11/2018 0939   CO2 27 02/11/2018 0939   BUN 10 02/11/2018 0939   CREATININE 0.82 02/11/2018 0939      Component Value Date/Time   CALCIUM 9.7 02/11/2018 0939   ALKPHOS 62 09/05/2016 1525   AST 16 02/11/2018 0939   ALT 10 02/11/2018 0939   BILITOT 0.5 02/11/2018 0939     Lab Results  Component Value Date   LIPASE 13 02/11/2018      Assessment and Plan: 37 y.o. female with right upper quadrant abdominal pain with normal ultrasound.. Kidney stone seen on ultrasound I do not think is the cause of pain.  Differential includes gallbladder dysfunction, duodenal ulcer, IBS.   Plan for HIDA scan with ejection fraction.  If this is unremarkable next step would likely be upper endoscopy.  At some point we should consider IBS and consider Bentyl during meals.  I think it be reasonable to do that after the HIDA scan if it is negative.  Obesity: Difficulty with weight loss.  I recommend using a bio impedance scale as this would help differentiate between water weight and fat mass.  Additionally I recommend a food log and measuring the calories.  I expect the reason she is not losing weight is probably because she is getting more calories than she estimates.  Follow up in a few weeks to go over food log and discuss weight loss strategies.  Will check TSH, A1c and Vitd.    We had a long discussion of results and treatment plan and next steps. Plan to transition care to PCP asap.    Orders Placed This Encounter  Procedures  . NM Hepato W/Eject Fract    Standing Status:   Future    Standing Expiration Date:    04/21/2019    Order Specific Question:   If indicated for the ordered procedure, I authorize the administration of a radiopharmaceutical per Radiology protocol    Answer:   Yes    Order Specific Question:   Is the patient pregnant?    Answer:   No    Order Specific Question:   Preferred imaging [facility]?    [facility]    Order Specific Question:   Radiology Contrast Protocol - do NOT remove file path    Answer:   [email or web address]   No orders of the defined types were placed in this encounter.    Discussed warning signs or symptoms. Please see discharge instructions. Patient expresses understanding.

## 2018-02-18 NOTE — Patient Instructions (Signed)
Thank you for coming in today. Get labs now.  You should hear about HIDA scan.  Get a bioimpedance scale  Track and measure your food.  Recheck with me after the HIDA scan.  Keep a belly pain log.    Abdominal Pain, Adult Abdominal pain can be caused by many things. Often, abdominal pain is not serious and it gets better with no treatment or by being treated at home. However, sometimes abdominal pain is serious. Your health care provider will do a medical history and a physical exam to try to determine the cause of your abdominal pain. Follow these instructions at home:  Take over-the-counter and prescription medicines only as told by your health care provider. Do not take a laxative unless told by your health care provider.  Drink enough fluid to keep your urine clear or pale yellow.  Watch your condition for any changes.  Keep all follow-up visits as told by your health care provider. This is important. Contact a health care provider if:  Your abdominal pain changes or gets worse.  You are not hungry or you lose weight without trying.  You are constipated or have diarrhea for more than 2-3 days.  You have pain when you urinate or have a bowel movement.  Your abdominal pain wakes you up at night.  Your pain gets worse with meals, after eating, or with certain foods.  You are throwing up and cannot keep anything down.  You have a fever. Get help right away if:  Your pain does not go away as soon as your health care provider told you to expect.  You cannot stop throwing up.  Your pain is only in areas of the abdomen, such as the right side or the left lower portion of the abdomen.  You have bloody or black stools, or stools that look like tar.  You have severe pain, cramping, or bloating in your abdomen.  You have signs of dehydration, such as: ? Dark urine, very little urine, or no urine. ? Cracked lips. ? Dry mouth. ? Sunken  eyes. ? Sleepiness. ? Weakness. This information is not intended to replace advice given to you by your health care provider. Make sure you discuss any questions you have with your health care provider. Document Released: 08/29/2005 Document Revised: 06/08/2016 Document Reviewed: 05/02/2016 Elsevier Interactive Patient Education  [employer]2018 [employer]vier [employer]

## 2018-02-19 ENCOUNTER — Other Ambulatory Visit: Payer: Self-pay | Admitting: Family Medicine

## 2018-02-19 ENCOUNTER — Telehealth: Payer: Self-pay | Admitting: Family Medicine

## 2018-02-19 ENCOUNTER — Encounter: Payer: Self-pay | Admitting: Family Medicine

## 2018-02-19 DIAGNOSIS — R7989 Other specified abnormal findings of blood chemistry: Secondary | ICD-10-CM

## 2018-02-19 NOTE — Telephone Encounter (Signed)
TSH low.  ADD on thyroid labs ordered.  If they cannot be added on we will get these labs at the next check.

## 2018-02-19 NOTE — Telephone Encounter (Signed)
Labs have been added. Rhonda Cunningham,CMA

## 2018-02-20 ENCOUNTER — Encounter: Payer: Self-pay | Admitting: Family Medicine

## 2018-02-25 LAB — HEMOGLOBIN A1C
EAG (MMOL/L): 5.4 (calc)
HEMOGLOBIN A1C: 5 %{Hb} (ref <5.7)
Mean Plasma Glucose: 97 (calc)

## 2018-02-25 LAB — T4, FREE: Free T4: 1.2 ng/dL (ref 0.8–1.8)

## 2018-02-25 LAB — VITAMIN D 25 HYDROXY (VIT D DEFICIENCY, FRACTURES): VIT D 25 HYDROXY: 25 ng/mL — AB (ref 30–100)

## 2018-02-25 LAB — T3, FREE: T3, Free: 2.8 pg/mL (ref 2.3–4.2)

## 2018-02-25 LAB — TSH: TSH: 0.34 m[IU]/L — AB

## 2018-02-25 LAB — THYROID PEROXIDASE ANTIBODY: Thyroperoxidase Ab SerPl-aCnc: <1 IU/mL (ref <9)

## 2018-02-25 LAB — THYROID STIMULATING IMMUNOGLOBULIN: TSI: 93 %{baseline} (ref <140)

## 2018-02-26 ENCOUNTER — Encounter (HOSPITAL_COMMUNITY)
Admission: RE | Admit: 2018-02-26 | Discharge: 2018-02-26 | Disposition: A | Discharge status: Home / Self Care | Payer: BLUE CROSS/BLUE SHIELD | Source: Ambulatory Visit | Attending: Family Medicine | Admitting: Family Medicine

## 2018-02-26 DIAGNOSIS — R110 Nausea: Secondary | ICD-10-CM | POA: Diagnosis not present

## 2018-02-26 DIAGNOSIS — R1011 Right upper quadrant pain: Secondary | ICD-10-CM | POA: Insufficient documentation

## 2018-02-26 MED ORDER — TECHNETIUM TC 99M MEBROFENIN IV KIT
5.0000 | PACK | Freq: Once | INTRAVENOUS | Status: AC | PRN
  Administered 2018-02-26: 5 via INTRAVENOUS

## 2018-03-17 ENCOUNTER — Ambulatory Visit (INDEPENDENT_AMBULATORY_CARE_PROVIDER_SITE_OTHER): Payer: BLUE CROSS/BLUE SHIELD | Admitting: Physician Assistant

## 2018-03-17 ENCOUNTER — Encounter: Payer: Self-pay | Admitting: Physician Assistant

## 2018-03-17 ENCOUNTER — Other Ambulatory Visit (HOSPITAL_COMMUNITY)
Admission: RE | Admit: 2018-03-17 | Discharge: 2018-03-17 | Disposition: A | Discharge status: Home / Self Care | Payer: BLUE CROSS/BLUE SHIELD | Source: Ambulatory Visit | Attending: Physician Assistant | Admitting: Physician Assistant

## 2018-03-17 VITALS — BP 130/75 | HR 80 | Ht 62.0 in | Wt 180.0 lb

## 2018-03-17 DIAGNOSIS — Z13220 Encounter for screening for lipoid disorders: Secondary | ICD-10-CM | POA: Diagnosis not present

## 2018-03-17 DIAGNOSIS — K921 Melena: Secondary | ICD-10-CM | POA: Diagnosis not present

## 2018-03-17 DIAGNOSIS — F411 Generalized anxiety disorder: Secondary | ICD-10-CM

## 2018-03-17 DIAGNOSIS — F5101 Primary insomnia: Secondary | ICD-10-CM | POA: Diagnosis not present

## 2018-03-17 DIAGNOSIS — Z01419 Encounter for gynecological examination (general) (routine) without abnormal findings: Secondary | ICD-10-CM | POA: Insufficient documentation

## 2018-03-17 DIAGNOSIS — R4589 Other symptoms and signs involving emotional state: Secondary | ICD-10-CM

## 2018-03-17 DIAGNOSIS — R1013 Epigastric pain: Secondary | ICD-10-CM | POA: Diagnosis not present

## 2018-03-17 DIAGNOSIS — R10813 Right lower quadrant abdominal tenderness: Secondary | ICD-10-CM | POA: Diagnosis not present

## 2018-03-17 DIAGNOSIS — K2950 Unspecified chronic gastritis without bleeding: Secondary | ICD-10-CM | POA: Diagnosis not present

## 2018-03-17 DIAGNOSIS — F329 Major depressive disorder, single episode, unspecified: Secondary | ICD-10-CM

## 2018-03-17 MED ORDER — OMEPRAZOLE 40 MG PO CPDR: 40.0000 mg | DELAYED_RELEASE_CAPSULE | Freq: Every day | ORAL | 3 refills | Status: DC

## 2018-03-17 MED ORDER — TRAZODONE HCL 50 MG PO TABS: 50.0000 mg | ORAL_TABLET | Freq: Every day | ORAL | 1 refills | Status: DC

## 2018-03-17 NOTE — Addendum Note (Signed)
Addended by: Jomarie LongsBREEBACK, JADE L on: 03/17/2018 12:21 PM   Modules accepted: Orders

## 2018-03-17 NOTE — Progress Notes (Addendum)
Subjective:    Patient ID: Lauren Wade, female    DOB: [birth year], 37 y.o.   MRN: [id number]  HPI  Patient is a 37 year old female who presents to the clinic for her Pap smear and annual exam.  She has a history of cervical dysplasia where she had a LEEP procedure done in 2006.  She is having normal menstrual cycle.  She admits that sometimes sex is painful.  She denies any vaginal discharge.  She does have some right lower quadrant tenderness.  She is concerned about ovarian cysts and masses.  She does have a family history of ovarian cyst.  She is having some problems with right upper quadrant and epigastric pain.  It has improved significantly with diet changes.  She has stopped BC powders.  She had a normal GI workup with normal abdominal ultrasound and normal HIDA scan.  She admits she has not been taking the omeprazole.  She did feel significantly better after GI cocktail when given in office.  H. pylori was also negative. She was having black stools but have since resolved.   She is also having a lot of problems with fatigue.  She believes it is because of her not sleeping.  On average she is getting 3-4 hours of sleep at night.  She has tried melatonin which has not helped.  No family hx of breast cancer, uterine cancer, ovarian cancer.  .. Active Ambulatory Problems    Diagnosis Date Noted  . GAD (generalized anxiety disorder) 01/07/2015  . Cervical dysplasia 01/07/2015  . Abnormal weight gain 01/07/2015  . Obese 01/07/2015  . Right shoulder pain 01/11/2015  . Rectocele 02/11/2018  . Epigastric pain 02/11/2018  . Black stools 02/11/2018  . Chronic idiopathic constipation 02/14/2018  . Low TSH level 02/19/2018  . Primary insomnia 03/17/2018  . Chronic gastritis without bleeding 03/17/2018  . Depressed mood 03/17/2018   Resolved Ambulatory Problems    Diagnosis Date Noted  . BRONCHITIS 01/21/2010  . RUQ pain 05/05/2015   Past Medical History:  Diagnosis Date  . IBS  (irritable bowel syndrome)    .Marland Kitchen Family History  Problem Relation Age of Onset  . Hyperlipidemia Father   . Depression Mother   . Depression Paternal Aunt    .Marland Kitchen Social History   Socioeconomic History  . Marital status: Married    Spouse name: Not on file  . Number of children: Not on file  . Years of education: Not on file  . Highest education level: Not on file  Occupational History  . Not on file  Social Needs  . Financial resource strain: Not on file  . Food insecurity:    Worry: Not on file    Inability: Not on file  . Transportation needs:    Medical: Not on file    Non-medical: Not on file  Tobacco Use  . Smoking status: Former Games developer  . Smokeless tobacco: Never Used  Substance and Sexual Activity  . Alcohol use: Yes  . Drug use: No  . Sexual activity: Yes  Lifestyle  . Physical activity:    Days per week: Not on file    Minutes per session: Not on file  . Stress: Not on file  Relationships  . Social connections:    Talks on phone: Not on file    Gets together: Not on file    Attends religious service: Not on file    Active member of club or organization: Not on file  Attends meetings of clubs or organizations: Not on file    Relationship status: Not on file  . Intimate partner violence:    Fear of current or ex partner: Not on file    Emotionally abused: Not on file    Physically abused: Not on file    Forced sexual activity: Not on file  Other Topics Concern  . Not on file  Social History Narrative  . Not on file        Review of Systems  All other systems reviewed and are negative.      Objective:   Physical Exam  Constitutional: She is oriented to person, place, and time. She appears well-developed and well-nourished.  HENT:  Head: Normocephalic and atraumatic.  Right Ear: External ear normal.  Left Ear: External ear normal.  Nose: Nose normal.  Mouth/Throat: Oropharynx is clear and moist.  Eyes: Conjunctivae are normal. Right  eye exhibits no discharge. Left eye exhibits no discharge.  Neck: Normal range of motion. Neck supple. No thyromegaly present.  Cardiovascular: Normal rate, regular rhythm and normal heart sounds.  Pulmonary/Chest: Effort normal and breath sounds normal. She has no wheezes.  Abdominal: Soft. Bowel sounds are normal. She exhibits no distension and no mass. There is tenderness. There is no rebound and no guarding.  Right lower quadrant tenderness to palpation. No guarding or rebound.   Genitourinary: Uterus normal. No vaginal discharge found.  Genitourinary Comments: Cervix friable and bled with pap brush. No polyps.   Musculoskeletal: Normal range of motion.  Lymphadenopathy:    She has no cervical adenopathy.  Neurological: She is alert and oriented to person, place, and time. She has normal reflexes. Coordination normal.  Skin: Skin is dry.  Psychiatric: She has a normal mood and affect. Her behavior is normal.          Assessment & Plan:  Marland KitchenMarland KitchenAmber was seen today for gynecologic exam.  Diagnoses and all orders for this visit:  Encounter for routine gynecological examination with Papanicolaou smear of cervix -     Cytology - PAP  Primary insomnia -     traZODone (DESYREL) 50 MG tablet; Take 1 tablet (50 mg total) by mouth at bedtime.  Black stools -     omeprazole (PRILOSEC) 40 MG capsule; Take 1 capsule (40 mg total) by mouth daily.  Epigastric pain -     omeprazole (PRILOSEC) 40 MG capsule; Take 1 capsule (40 mg total) by mouth daily.  Screening for lipid disorders -     Lipid Panel w/reflex Direct LDL  Chronic gastritis without bleeding, unspecified gastritis type -     omeprazole (PRILOSEC) 40 MG capsule; Take 1 capsule (40 mg total) by mouth daily.  GAD (generalized anxiety disorder)  Depressed mood   .Marland Kitchen Depression screen [facility] 2/9 03/17/2018 09/10/2016  Decreased Interest 2 1  Down, Depressed, Hopeless 2 1  PHQ - 2 Score 4 2  Altered sleeping 3 3  Tired,  decreased energy 3 3  Change in appetite 3 2  Feeling bad or failure about yourself  1 3  Trouble concentrating 2 2  Moving slowly or fidgety/restless 0 1  Suicidal thoughts 0 0  PHQ-9 Score 16 16  Difficult doing work/chores Somewhat difficult -    .. GAD 7 : Generalized Anxiety Score 03/17/2018 09/10/2016  Nervous, Anxious, on Edge 2 2  Control/stop worrying 2 2  Worry too much - different things 3 3  Trouble relaxing 2 2  Restless 0 3  Easily  annoyed or irritable 2 3  Afraid - awful might happen 0 1  Total GAD 7 Score 11 16  Anxiety Difficulty - Very difficult    .Marland Kitchen. Discussed 150 minutes of exercise a week.  Encouraged vitamin D 1000 units and Calcium 1300mg  or 4 servings of dairy a day.  Lipid panel ordered. Other labs done a few weeks ago.  Pap done today.  STD urine GC and Chlamydia completed today.  Certainly her depression anxiety numbers were more elevated than I would like.  She is actively working on exercise as well as she has gone to the local herb shop and got a stress relief formula.  She is only been taking the formula for about 1 week.  She does feel like it is helping.  We will follow-up as needed.  Discussed I feel like her epigastric and right upper quadrant pain is coming from a gastritis/possible gastric ulcer.  She has not been taking the omeprazole.  I would like for her to restart omeprazole for the next 8 weeks.  Continue to keep her diet changes that seemingly have helped some.  If not improving I think we should proceed with endoscopy.  Her full workup thus far has been negative for any concerning features.  For sleep will start trazodone. Discussed side effects. Discussed good sleep routine.   Right lower tenderness on exam. Will get u/s.   Follow up in 2 months.

## 2018-03-17 NOTE — Patient Instructions (Signed)

## 2018-03-19 ENCOUNTER — Ambulatory Visit (INDEPENDENT_AMBULATORY_CARE_PROVIDER_SITE_OTHER): Payer: BLUE CROSS/BLUE SHIELD

## 2018-03-19 ENCOUNTER — Other Ambulatory Visit: Payer: BLUE CROSS/BLUE SHIELD

## 2018-03-19 DIAGNOSIS — R10813 Right lower quadrant abdominal tenderness: Secondary | ICD-10-CM | POA: Diagnosis not present

## 2018-03-19 DIAGNOSIS — R1031 Right lower quadrant pain: Secondary | ICD-10-CM

## 2018-03-19 LAB — CYTOLOGY - PAP
DIAGNOSIS: NEGATIVE
HPV: NOT DETECTED

## 2018-03-19 NOTE — Progress Notes (Signed)
Call pt: overall great u/s. You did have some free fluid noted could be a ruptured ovarian cyst recently.

## 2018-03-19 NOTE — Progress Notes (Signed)
Call pt: HPV negative. No abnormal cells. Next pap 3 years. Continue annual physicals.

## 2018-03-24 ENCOUNTER — Other Ambulatory Visit: Payer: Self-pay

## 2018-03-24 DIAGNOSIS — Z113 Encounter for screening for infections with a predominantly sexual mode of transmission: Secondary | ICD-10-CM

## 2018-05-08 ENCOUNTER — Encounter: Payer: Self-pay | Admitting: Physician Assistant

## 2018-05-15 ENCOUNTER — Encounter: Payer: Self-pay | Admitting: Physician Assistant

## 2018-05-16 ENCOUNTER — Encounter: Payer: Self-pay | Admitting: Physician Assistant

## 2018-05-20 ENCOUNTER — Encounter: Payer: Self-pay | Admitting: Physician Assistant

## 2018-05-20 ENCOUNTER — Ambulatory Visit: Payer: BLUE CROSS/BLUE SHIELD | Admitting: Physician Assistant

## 2018-05-20 VITALS — BP 127/84 | HR 84 | Temp 98.4°F | Resp 16 | Ht 62.0 in | Wt 187.0 lb

## 2018-05-20 DIAGNOSIS — Z6836 Body mass index (BMI) 36.0-36.9, adult: Secondary | ICD-10-CM

## 2018-05-20 DIAGNOSIS — F411 Generalized anxiety disorder: Secondary | ICD-10-CM | POA: Diagnosis not present

## 2018-05-20 DIAGNOSIS — E6609 Other obesity due to excess calories: Secondary | ICD-10-CM | POA: Diagnosis not present

## 2018-05-20 DIAGNOSIS — F329 Major depressive disorder, single episode, unspecified: Secondary | ICD-10-CM | POA: Diagnosis not present

## 2018-05-20 DIAGNOSIS — R4589 Other symptoms and signs involving emotional state: Secondary | ICD-10-CM

## 2018-05-20 MED ORDER — ESCITALOPRAM OXALATE 10 MG PO TABS: 10.0000 mg | ORAL_TABLET | Freq: Every day | ORAL | 1 refills | Status: DC

## 2018-05-20 MED ORDER — BUSPIRONE HCL 7.5 MG PO TABS: 7.5000 mg | ORAL_TABLET | Freq: Two times a day (BID) | ORAL | 1 refills | Status: DC

## 2018-05-20 NOTE — Progress Notes (Signed)
Subjective:    Patient ID: Lauren Wade, female    DOB: July 09, 1981, 37 y.o.   MRN: [id number]  HPI  Pt is a 37 yo female who presents to the clinic upset about her weight and very depressed and anxious. She states "something has to be done I am mean to my kids and I hate myself". She only wears work out clothes. She is the biggest she has been in her life. She has no motivation. She is tearful and doesn't find pleasure in doing things. She has trouble sleeping. Melatonin does help. She has tried anti depressants in the past such as paxil, zoloft, wellbutrin and not helped and had side effects. She is working out and eating right for the last month and not lost any weight. This is very frustrating to her and makes her want to quit. Her marriage is not the best and she doesn't want to do anything about it because she doesn't like herself. No SI/HC.  Marland Kitchen. Active Ambulatory Problems    Diagnosis Date Noted  . GAD (generalized anxiety disorder) 01/07/2015  . Cervical dysplasia 01/07/2015  . Abnormal weight gain 01/07/2015  . Class 2 obesity due to excess calories without serious comorbidity with body mass index (BMI) of 36.0 to 36.9 in adult 01/07/2015  . Right shoulder pain 01/11/2015  . Rectocele 02/11/2018  . Epigastric pain 02/11/2018  . Black stools 02/11/2018  . Chronic idiopathic constipation 02/14/2018  . Low TSH level 02/19/2018  . Primary insomnia 03/17/2018  . Chronic gastritis without bleeding 03/17/2018  . Depressed mood 03/17/2018   Resolved Ambulatory Problems    Diagnosis Date Noted  . BRONCHITIS 01/21/2010  . RUQ pain 05/05/2015   Past Medical History:  Diagnosis Date  . IBS (irritable bowel syndrome)      Review of Systems  All other systems reviewed and are negative.      Objective:   Physical Exam  Constitutional: She is oriented to person, place, and time. She appears well-developed and well-nourished.  HENT:  Head: Normocephalic and atraumatic.   Cardiovascular: Normal rate and regular rhythm.  Pulmonary/Chest: Effort normal and breath sounds normal.  Neurological: She is alert and oriented to person, place, and time.  Psychiatric: She has a normal mood and affect. Her behavior is normal.          Assessment & Plan:  Marland KitchenMarland KitchenAmber was seen today for weight management and aniexity medication consult.  Diagnoses and all orders for this visit:  Depressed mood -     escitalopram (LEXAPRO) 10 MG tablet; Take 1 tablet (10 mg total) by mouth daily. -     busPIRone (BUSPAR) 7.5 MG tablet; Take 1 tablet (7.5 mg total) by mouth 2 (two) times daily.  GAD (generalized anxiety disorder) -     escitalopram (LEXAPRO) 10 MG tablet; Take 1 tablet (10 mg total) by mouth daily. -     busPIRone (BUSPAR) 7.5 MG tablet; Take 1 tablet (7.5 mg total) by mouth 2 (two) times daily.  Class 2 obesity due to excess calories without serious comorbidity with body mass index (BMI) of 36.0 to 36.9 in adult   .Marland Kitchen Depression screen [facility] 2/9 05/20/2018 03/17/2018 09/10/2016  Decreased Interest 1 2 1   Down, Depressed, Hopeless 2 2 1   PHQ - 2 Score 3 4 2   Altered sleeping 2 3 3   Tired, decreased energy 3 3 3   Change in appetite 3 3 2   Feeling bad or failure about yourself  2 1 3  Trouble concentrating 3 2 2   Moving slowly or fidgety/restless 0 0 1  Suicidal thoughts 0 0 0  PHQ-9 Score 16 16 16   Difficult doing work/chores Somewhat difficult Somewhat difficult -   .. GAD 7 : Generalized Anxiety Score 03/17/2018 09/10/2016  Nervous, Anxious, on Edge 2 2  Control/stop worrying 2 2  Worry too much - different things 3 3  Trouble relaxing 2 2  Restless 0 3  Easily annoyed or irritable 2 3  Afraid - awful might happen 0 1  Total GAD 7 Score 11 16  Anxiety Difficulty - Very difficult   Pt on paper has done everything right. Encouraged her to keep it up. Certainly her stress could be causing her to hold on to more weight. We are going to try medication with  lexapro and buspar. Discussed side effects. Give it 4-6 weeks. She has tried phentermine in the past but did not work the last time. Her insurance will not pay for any other weight loss medications. Discussed balanced diet. Gave her information on some OTC weight loss/energy supplements to consider. I really think she would benefit from counseling.  Follow up in 6 weeks.   Marland Kitchen..Spent 30 minutes with patient and greater than 50 percent of visit spent counseling patient regarding treatment plan.

## 2018-05-20 NOTE — Patient Instructions (Signed)
Slim roast coffee/cocca.

## 2018-05-22 ENCOUNTER — Encounter: Payer: Self-pay | Admitting: Physician Assistant

## 2018-06-10 ENCOUNTER — Encounter: Payer: Self-pay | Admitting: Physician Assistant

## 2018-06-12 ENCOUNTER — Other Ambulatory Visit: Payer: Self-pay | Admitting: Physician Assistant

## 2018-06-12 DIAGNOSIS — R41840 Attention and concentration deficit: Secondary | ICD-10-CM

## 2018-06-20 ENCOUNTER — Encounter: Payer: Self-pay | Admitting: Physician Assistant

## 2018-06-24 ENCOUNTER — Encounter: Payer: Self-pay | Admitting: Family Medicine

## 2018-06-24 NOTE — Telephone Encounter (Signed)
Pt reports since the increase in her Lexapro (from 10mg  to 15mg ) her anxiety has gotten worse. She reports anxiety attacks (heart racing, chest discomfort).   Pt has not taken any Rx today, she usually takes it in the am.

## 2018-07-07 ENCOUNTER — Encounter: Payer: Self-pay | Admitting: Family Medicine

## 2018-07-07 ENCOUNTER — Ambulatory Visit: Payer: BLUE CROSS/BLUE SHIELD | Admitting: Family Medicine

## 2018-07-15 ENCOUNTER — Ambulatory Visit: Payer: BLUE CROSS/BLUE SHIELD | Admitting: Physician Assistant

## 2018-07-15 ENCOUNTER — Ambulatory Visit: Payer: BLUE CROSS/BLUE SHIELD | Admitting: Family Medicine

## 2018-07-15 ENCOUNTER — Encounter: Payer: Self-pay | Admitting: Physician Assistant

## 2018-07-15 VITALS — BP 127/64 | HR 107 | Ht 62.0 in | Wt 185.0 lb

## 2018-07-15 DIAGNOSIS — R4589 Other symptoms and signs involving emotional state: Secondary | ICD-10-CM

## 2018-07-15 DIAGNOSIS — F329 Major depressive disorder, single episode, unspecified: Secondary | ICD-10-CM

## 2018-07-15 DIAGNOSIS — F411 Generalized anxiety disorder: Secondary | ICD-10-CM | POA: Diagnosis not present

## 2018-07-15 DIAGNOSIS — R4586 Emotional lability: Secondary | ICD-10-CM

## 2018-07-15 DIAGNOSIS — N200 Calculus of kidney: Secondary | ICD-10-CM | POA: Diagnosis not present

## 2018-07-15 DIAGNOSIS — R454 Irritability and anger: Secondary | ICD-10-CM

## 2018-07-15 MED ORDER — LAMOTRIGINE 25 MG PO TABS: ORAL_TABLET | ORAL | 0 refills | Status: DC

## 2018-07-15 MED ORDER — TAMSULOSIN HCL 0.4 MG PO CAPS: 0.4000 mg | ORAL_CAPSULE | Freq: Every day | ORAL | 0 refills | Status: DC

## 2018-07-15 NOTE — Progress Notes (Signed)
Subjective:    Patient ID: Lauren SinclairAmber M Kersting, female    DOB: 1981/10/22, 37 y.o.   MRN: [id number]  HPI  Pt is a 37 yo female who presents to the clinic to follow up on anxiety, depression, mood changes. She started lexapro and seemed to work well at first. We then increased lexapro and she noticed her anxiety was much better but she had no motivation to do anything. She cared about nothing. No SI/HC. She stopped but now her anger and irritability is terrible. She can't stop thinking about worse case scernio. She flips out at her kids and husband all the time.   She continues to have right flank pain intermittently. U/S in march showed 4mm kidney stone. She wonders if it is moving and if it could be causing any damage to kidney. No nausea, vomiting, dysuria.    .. Active Ambulatory Problems    Diagnosis Date Noted  . GAD (generalized anxiety disorder) 01/07/2015  . Cervical dysplasia 01/07/2015  . Abnormal weight gain 01/07/2015  . Class 2 obesity due to excess calories without serious comorbidity with body mass index (BMI) of 36.0 to 36.9 in adult 01/07/2015  . Right shoulder pain 01/11/2015  . Rectocele 02/11/2018  . Epigastric pain 02/11/2018  . Black stools 02/11/2018  . Chronic idiopathic constipation 02/14/2018  . Low TSH level 02/19/2018  . Primary insomnia 03/17/2018  . Chronic gastritis without bleeding 03/17/2018  . Depressed mood 03/17/2018  . Right kidney stone 07/17/2018  . Irritability and anger 07/17/2018  . Mood changes 07/17/2018   Resolved Ambulatory Problems    Diagnosis Date Noted  . BRONCHITIS 01/21/2010  . RUQ pain 05/05/2015   Past Medical History:  Diagnosis Date  . IBS (irritable bowel syndrome)        Review of Systems See HPI.     Objective:   Physical Exam  Constitutional: She is oriented to person, place, and time. She appears well-developed and well-nourished.  HENT:  Head: Normocephalic and atraumatic.  Cardiovascular: Normal rate and  regular rhythm.  Pulmonary/Chest: Effort normal and breath sounds normal.  No CVA tenderness.   Neurological: She is alert and oriented to person, place, and time.  Psychiatric: She has a normal mood and affect. Her behavior is normal.          Assessment & Plan:  Marland Kitchen.Marland Kitchen.Diagnoses and all orders for this visit:  Depressed mood -     lamoTRIgine (LAMICTAL) 25 MG tablet; Take one tablet daily and increase by 1 tablet every week until reach to 100mg  daily.  GAD (generalized anxiety disorder) -     lamoTRIgine (LAMICTAL) 25 MG tablet; Take one tablet daily and increase by 1 tablet every week until reach to 100mg  daily.  Right kidney stone -     tamsulosin (FLOMAX) 0.4 MG CAPS capsule; Take 1 capsule (0.4 mg total) by mouth daily.  Mood changes -     lamoTRIgine (LAMICTAL) 25 MG tablet; Take one tablet daily and increase by 1 tablet every week until reach to 100mg  daily.  Irritability and anger -     lamoTRIgine (LAMICTAL) 25 MG tablet; Take one tablet daily and increase by 1 tablet every week until reach to 100mg  daily.    .. [facility] 07/15/2018 05/20/2018 03/17/2018 09/10/2016  Decreased Interest 2 1 2 1   Down, Depressed, Hopeless 2 2 2 1   PHQ - 2 Score 4 3 4 2   Altered sleeping 1 2 3 3   Tired, decreased energy  2 3 3 3   Change in appetite 3 3 3 2   Feeling bad or failure about yourself  2 2 1 3   Trouble concentrating 3 3 2 2   Moving slowly or fidgety/restless 0 0 0 1  Suicidal thoughts 0 0 0 0  PHQ-9 Score 15 16 16 16   Difficult doing work/chores Very difficult Somewhat difficult Somewhat difficult -   .. GAD 7 : Generalized Anxiety Score 07/15/2018 03/17/2018 09/10/2016  Nervous, Anxious, on Edge 2 2 2   Control/stop worrying 3 2 2   Worry too much - different things 3 3 3   Trouble relaxing 2 2 2   Restless 1 0 3  Easily annoyed or irritable 3 2 3   Afraid - awful might happen 2 0 1  Total GAD 7 Score 16 11 16   Anxiety Difficulty Very difficult - Very difficult     Added lamictal. Consider adding wellbutrin in the future our only concern was increase in anxiety. Discussed side effects and titration up. Follow up in 6 weeks.   Discussed as long as stone is in the pole of kidney non obstructing it is not doing any damage. Do another round flomax and see if any movement associated. We could rescan via u/s to see if any change in location. We can recheck kidney function then as well.   Marland Kitchen..Spent 30 minutes with patient and greater than 50 percent of visit spent counseling patient regarding treatment plan.

## 2018-07-17 DIAGNOSIS — N200 Calculus of kidney: Secondary | ICD-10-CM | POA: Insufficient documentation

## 2018-07-17 DIAGNOSIS — R4586 Emotional lability: Secondary | ICD-10-CM | POA: Insufficient documentation

## 2018-07-17 DIAGNOSIS — R454 Irritability and anger: Secondary | ICD-10-CM | POA: Insufficient documentation

## 2018-08-25 ENCOUNTER — Ambulatory Visit (HOSPITAL_BASED_OUTPATIENT_CLINIC_OR_DEPARTMENT_OTHER)
Admission: RE | Admit: 2018-08-25 | Discharge: 2018-08-25 | Disposition: A | Discharge status: Home / Self Care | Payer: BLUE CROSS/BLUE SHIELD | Source: Ambulatory Visit | Attending: Physician Assistant | Admitting: Physician Assistant

## 2018-08-25 ENCOUNTER — Ambulatory Visit: Payer: BLUE CROSS/BLUE SHIELD | Admitting: Physician Assistant

## 2018-08-25 ENCOUNTER — Encounter: Payer: Self-pay | Admitting: Physician Assistant

## 2018-08-25 VITALS — BP 124/80 | HR 86 | Ht 62.0 in | Wt 188.0 lb

## 2018-08-25 DIAGNOSIS — S8011XA Contusion of right lower leg, initial encounter: Secondary | ICD-10-CM | POA: Diagnosis not present

## 2018-08-25 DIAGNOSIS — F5081 Binge eating disorder: Secondary | ICD-10-CM | POA: Diagnosis not present

## 2018-08-25 DIAGNOSIS — M79661 Pain in right lower leg: Secondary | ICD-10-CM

## 2018-08-25 DIAGNOSIS — T148XXA Other injury of unspecified body region, initial encounter: Secondary | ICD-10-CM | POA: Insufficient documentation

## 2018-08-25 DIAGNOSIS — F50819 Binge eating disorder, unspecified: Secondary | ICD-10-CM | POA: Insufficient documentation

## 2018-08-25 MED ORDER — LISDEXAMFETAMINE DIMESYLATE 20 MG PO CAPS: 20.0000 mg | ORAL_CAPSULE | ORAL | 0 refills | Status: DC

## 2018-08-25 NOTE — Progress Notes (Signed)
Called patient: negative for DVT. Discussed ice, NSAIds, REST. Call with any signs of infection.

## 2018-08-25 NOTE — Progress Notes (Deleted)
   Subjective:    Patient ID: Lauren Wade, female    DOB: 06/30/1981, 37 y.o.   MRN: [id number]  HPI  Binge-cook out 3 meals a day whits ice cream  Review of Systems     Objective:   Physical Exam        Assessment & Plan:

## 2018-08-25 NOTE — Progress Notes (Signed)
Subjective:     Patient ID: Lauren Wade, female   DOB: [birth year], 37 y.o.   MRN: [id number]  HPI Patient is a 37 yo female presenting today complaining of a spider bite. Patient states that she did not see any insect bite her however she noticed a what she thinks is a bite and some bruising surrounding the bite on her right calf. She states that on Tuesday and Wednesday she felt like her leg was cramping and says that she felt like she was "getting restless leg syndrome." On Thursday she noticed a bruise and on Friday she had some itching and noticed the bruising was getting larger. She states that it itches and the bruising is tender to touch. She does state she spends a lot of time outside, but says her legs are usually covered. She denies abdominal pain, n/v, chills, fever, chest pain, or SOB. She did says she had some joint pain in her knees and ankles. She also admits to a headache, but states she gets headaches frequently and would not be able to relate this headache to the bite. She does not have any allergies that she is aware of. She has not tried taking any medications for the bite. Her grandmother had multiple blood clots in her legs.   She continues to overeat and want to lose weight. She admits to "being good" for awhile and then 'binging". She will have days where she eats out every meal and can't get enough fast food. She will often go out and get ice cream at night. At times she goes all day barely eating and then can't stop after dinner. She is staying active and going to the gym at least 3 times a week.   .. Active Ambulatory Problems    Diagnosis Date Noted  . GAD (generalized anxiety disorder) 01/07/2015  . Cervical dysplasia 01/07/2015  . Abnormal weight gain 01/07/2015  . Class 2 obesity due to excess calories without serious comorbidity with body mass index (BMI) of 36.0 to 36.9 in adult 01/07/2015  . Right shoulder pain 01/11/2015  . Rectocele 02/11/2018  . Epigastric  pain 02/11/2018  . Black stools 02/11/2018  . Chronic idiopathic constipation 02/14/2018  . Low TSH level 02/19/2018  . Primary insomnia 03/17/2018  . Chronic gastritis without bleeding 03/17/2018  . Depressed mood 03/17/2018  . Right kidney stone 07/17/2018  . Irritability and anger 07/17/2018  . Mood changes 07/17/2018  . Binge eating disorder 08/25/2018   Resolved Ambulatory Problems    Diagnosis Date Noted  . BRONCHITIS 01/21/2010  . RUQ pain 05/05/2015   Past Medical History:  Diagnosis Date  . IBS (irritable bowel syndrome)       Review of Systems  Constitutional: Negative for chills and fever.  Respiratory: Negative for shortness of breath.   Cardiovascular: Negative for chest pain.  Gastrointestinal: Negative for abdominal pain, nausea and vomiting.  Musculoskeletal: Negative for myalgias.  Neurological: Positive for headaches.       Objective:   Physical Exam  Constitutional: She is oriented to person, place, and time. She appears well-developed and well-nourished.  HENT:  Head: Normocephalic and atraumatic.  Cardiovascular: Normal rate and regular rhythm.  Pulmonary/Chest: Effort normal and breath sounds normal.  Musculoskeletal: She exhibits tenderness.  Neurological: She is alert and oriented to person, place, and time.  Skin: Skin is warm. Bruising noted.  Psychiatric: She has a normal mood and affect. Her behavior is normal.   6x7cm bruise noted on right calf  with red scab in the middle. Bruise is tender to the touch and skin is warm. Right calf measured at 16 inches and left calf at 15.5 inches.      Assessment:     Marland Kitchen.Marland Kitchen.Diagnoses and all orders for this visit:  Right calf pain -     US Venous Img Lower Unilateral Right  Bruising -     US Venous Img Lower Unilateral Right  Binge eating disorder -     lisdexamfetamine (VYVANSE) 20 MG capsule; Take 1 capsule (20 mg total) by mouth every morning. For binge eating.       Plan:     Low  suspicion for DVT however due to family hx and increased calf size and bruising and weight gain will get venous u/s.   U/s negative for DVT. Brings in pictures of progression and does seem to be improving some. Ice, light compression, NSAIds, elevation. Discussed signs of infection. Follow up as needed.   Discussed binge eating. Start vyvanse. Discussed side effects. Follow up in 1 month. Marland Kitchen..Discussed low carb diet with 1500 calories and 80g of protein.  Exercising at least 150 minutes a week.  My Fitness Pal could be a [employer] resource.    Marland Kitchen..Spent 30 minutes with patient and greater than 50 percent of visit spent counseling patient regarding treatment plan.  Marland Kitchen.Harlon Flor.I, Andy Moye PA-C, have reviewed and agree with the above documentation in it's entirety.

## 2018-09-01 ENCOUNTER — Ambulatory Visit (INDEPENDENT_AMBULATORY_CARE_PROVIDER_SITE_OTHER): Payer: BLUE CROSS/BLUE SHIELD | Admitting: Physician Assistant

## 2018-09-01 ENCOUNTER — Encounter: Payer: Self-pay | Admitting: Physician Assistant

## 2018-09-01 VITALS — BP 106/74 | HR 81 | Temp 98.0°F | Ht 62.0 in | Wt 185.0 lb

## 2018-09-01 DIAGNOSIS — J0100 Acute maxillary sinusitis, unspecified: Secondary | ICD-10-CM

## 2018-09-01 DIAGNOSIS — H9201 Otalgia, right ear: Secondary | ICD-10-CM | POA: Diagnosis not present

## 2018-09-01 MED ORDER — AMOXICILLIN-POT CLAVULANATE 875-125 MG PO TABS: 1.0000 | ORAL_TABLET | Freq: Two times a day (BID) | ORAL | 0 refills | Status: DC

## 2018-09-01 NOTE — Progress Notes (Signed)
   Subjective:    Patient ID: Lauren Wade, female    DOB: 1980-12-16, 37 y.o.   MRN: [id number]  HPI  Patient is a 37 year old female who presents to the clinic with 1 day of right ear pain.  She admits she has been having lots of sinus pressure, nasal congestion, headaches for the last week.  Her son was recently diagnosed with an ear infection.  Her younger son was diagnosed with sinus infection.  She denies any fever, chills, body aches.  Her ear pain is 7 out of 10 pain scale.  She has taken some BC powders which have helped with the ear pain some.  .. Active Ambulatory Problems    Diagnosis Date Noted  . GAD (generalized anxiety disorder) 01/07/2015  . Cervical dysplasia 01/07/2015  . Abnormal weight gain 01/07/2015  . Class 2 obesity due to excess calories without serious comorbidity with body mass index (BMI) of 36.0 to 36.9 in adult 01/07/2015  . Right shoulder pain 01/11/2015  . Rectocele 02/11/2018  . Epigastric pain 02/11/2018  . Black stools 02/11/2018  . Chronic idiopathic constipation 02/14/2018  . Low TSH level 02/19/2018  . Primary insomnia 03/17/2018  . Chronic gastritis without bleeding 03/17/2018  . Depressed mood 03/17/2018  . Right kidney stone 07/17/2018  . Irritability and anger 07/17/2018  . Mood changes 07/17/2018  . Binge eating disorder 08/25/2018   Resolved Ambulatory Problems    Diagnosis Date Noted  . BRONCHITIS 01/21/2010  . RUQ pain 05/05/2015   Past Medical History:  Diagnosis Date  . IBS (irritable bowel syndrome)        Review of Systems    see HPI.  Objective:   Physical Exam  Constitutional: She is oriented to person, place, and time. She appears well-developed and well-nourished.  HENT:  Head: Normocephalic and atraumatic.  Left Ear: External ear normal.  Nose: Nose normal.  Mouth/Throat: Oropharynx is clear and moist. No oropharyngeal exudate.  Tympanic membrane on the left very erythematous with some fluid at the  base.  Tympanic membrane on the right bulging a little more than the left with some erythema. Tenderness over tragus with pulling up on auricle.   Tenderness over maxillary sinuses to palpation.   Eyes: Conjunctivae and EOM are normal.  Neck: Normal range of motion. Neck supple.  Cardiovascular: Normal rate and regular rhythm.  Pulmonary/Chest: Effort normal and breath sounds normal. She has no wheezes.  Lymphadenopathy:    She has no cervical adenopathy.  Neurological: She is alert and oriented to person, place, and time.  Psychiatric: She has a normal mood and affect. Her behavior is normal.          Assessment & Plan:  Marland KitchenMarland KitchenDiagnoses and all orders for this visit:  Acute non-recurrent maxillary sinusitis -     amoxicillin-clavulanate (AUGMENTIN) 875-125 MG tablet; Take 1 tablet by mouth 2 (two) times daily. For 10 days.  Right ear pain -     amoxicillin-clavulanate (AUGMENTIN) 875-125 MG tablet; Take 1 tablet by mouth 2 (two) times daily. For 10 days.   I suspect that she has some sinusitis that could be moving into her ears.  She did have signs of infection in her right ear.  Treated with Augmentin for 10 days.  Encouraged her to pick up Flonase and start using daily for the next few days.  Handout given.  Follow-up as needed.  Certainly consider ibuprofen/BC powders as needed for pain until the antibiotic can kick in.

## 2018-09-01 NOTE — Patient Instructions (Signed)
flonase and augmentin.    Sinusitis, Adult Sinusitis is soreness and inflammation of your sinuses. Sinuses are hollow spaces in the bones around your face. They are located:  Around your eyes.  In the middle of your forehead.  Behind your nose.  In your cheekbones.  Your sinuses and nasal passages are lined with a stringy fluid (mucus). Mucus normally drains out of your sinuses. When your nasal tissues get inflamed or swollen, the mucus can get trapped or blocked so air cannot flow through your sinuses. This lets bacteria, viruses, and funguses grow, and that leads to infection. Follow these instructions at home: Medicines  Take, use, or apply over-the-counter and prescription medicines only as told by your doctor. These may include nasal sprays.  If you were prescribed an antibiotic medicine, take it as told by your doctor. Do not stop taking the antibiotic even if you start to feel better. Hydrate and Humidify  Drink enough water to keep your pee (urine) clear or pale yellow.  Use a cool mist humidifier to keep the humidity level in your home above 50%.  Breathe in steam for 10-15 minutes, 3-4 times a day or as told by your doctor. You can do this in the bathroom while a hot shower is running.  Try not to spend time in cool or dry air. Rest  Rest as much as possible.  Sleep with your head raised (elevated).  Make sure to get enough sleep each night. General instructions  Put a warm, moist washcloth on your face 3-4 times a day or as told by your doctor. This will help with discomfort.  Wash your hands often with soap and water. If there is no soap and water, use hand sanitizer.  Do not smoke. Avoid being around people who are smoking (secondhand smoke).  Keep all follow-up visits as told by your doctor. This is important. Contact a doctor if:  You have a fever.  Your symptoms get worse.  Your symptoms do not get better within 10 days. Get help right away  if:  You have a very bad headache.  You cannot stop throwing up (vomiting).  You have pain or swelling around your face or eyes.  You have trouble seeing.  You feel confused.  Your neck is stiff.  You have trouble breathing. This information is not intended to replace advice given to you by your health care provider. Make sure you discuss any questions you have with your health care provider. Document Released: 05/07/2008 Document Revised: 07/15/2016 Document Reviewed: 09/14/2015 Elsevier Interactive Patient Education  [employer].

## 2018-09-16 ENCOUNTER — Encounter: Payer: Self-pay | Admitting: Physician Assistant

## 2018-09-22 ENCOUNTER — Ambulatory Visit: Payer: BLUE CROSS/BLUE SHIELD | Admitting: Physician Assistant

## 2018-09-22 ENCOUNTER — Encounter: Payer: Self-pay | Admitting: Physician Assistant

## 2018-09-22 VITALS — BP 121/73 | HR 84 | Ht 62.0 in | Wt 179.0 lb

## 2018-09-22 DIAGNOSIS — F5081 Binge eating disorder: Secondary | ICD-10-CM

## 2018-09-22 DIAGNOSIS — R238 Other skin changes: Secondary | ICD-10-CM | POA: Diagnosis not present

## 2018-09-22 DIAGNOSIS — Z13220 Encounter for screening for lipoid disorders: Secondary | ICD-10-CM | POA: Diagnosis not present

## 2018-09-22 DIAGNOSIS — Z131 Encounter for screening for diabetes mellitus: Secondary | ICD-10-CM | POA: Diagnosis not present

## 2018-09-22 DIAGNOSIS — R233 Spontaneous ecchymoses: Secondary | ICD-10-CM | POA: Insufficient documentation

## 2018-09-22 DIAGNOSIS — R7989 Other specified abnormal findings of blood chemistry: Secondary | ICD-10-CM

## 2018-09-22 DIAGNOSIS — G2581 Restless legs syndrome: Secondary | ICD-10-CM | POA: Diagnosis not present

## 2018-09-22 LAB — COMPLETE METABOLIC PANEL WITH GFR
AG Ratio: 1.7 (calc) (ref 1.0–2.5)
ALT: 8 U/L (ref 6–29)
AST: 14 U/L (ref 10–30)
Albumin: 4 g/dL (ref 3.6–5.1)
Alkaline phosphatase (APISO): 56 U/L (ref 33–115)
BUN: 12 mg/dL (ref 7–25)
CO2: 25 mmol/L (ref 20–32)
CREATININE: 1.01 mg/dL (ref 0.50–1.10)
Calcium: 9.2 mg/dL (ref 8.6–10.2)
Chloride: 106 mmol/L (ref 98–110)
GFR, Est African American: 83 mL/min/{1.73_m2} (ref >=60)
GFR, Est Non African American: 72 mL/min/{1.73_m2} (ref >=60)
GLUCOSE: 87 mg/dL (ref 65–99)
Globulin: 2.4 g/dL (calc) (ref 1.9–3.7)
Potassium: 4.1 mmol/L (ref 3.5–5.3)
SODIUM: 139 mmol/L (ref 135–146)
Total Bilirubin: 0.4 mg/dL (ref 0.2–1.2)
Total Protein: 6.4 g/dL (ref 6.1–8.1)

## 2018-09-22 LAB — LIPID PANEL W/REFLEX DIRECT LDL
CHOL/HDL RATIO: 3.5 (calc) (ref <5.0)
Cholesterol: 173 mg/dL (ref <200)
HDL: 49 mg/dL — ABNORMAL LOW (ref >50)
LDL Cholesterol (Calc): 108 mg/dL (calc) — ABNORMAL HIGH
Non-HDL Cholesterol (Calc): 124 mg/dL (calc) (ref <130)
Triglycerides: 70 mg/dL (ref <150)

## 2018-09-22 LAB — TSH: TSH: 0.47 m[IU]/L

## 2018-09-22 MED ORDER — LISDEXAMFETAMINE DIMESYLATE 30 MG PO CAPS: 30.0000 mg | ORAL_CAPSULE | Freq: Every day | ORAL | 0 refills | Status: DC

## 2018-09-22 NOTE — Progress Notes (Signed)
   Subjective:    Patient ID: Lauren Wade, female    DOB: [birth year], 37 y.o.   MRN: [id number]  HPI Pt is a 37 yo female with binge eating disorder who was started on vyvanse and here to follow up on medication.   She is doing great on vyvanse. She has not binged at all. She is more conscious of what she is eating and seems to have more self controlled. She fines vyvanse also keeps her much more task oriented. She has many signs and symptoms of ADHD but never been tested. She feels like her overall mood and anxiety have improved.  She is concerned about easy bruising. Mostly on her lower extermities. She does run into things and overall rough but feels like bruising is worse than her injury. Denies any nose bleeds or uncontrolled bleeding. She does have some restless leg feelings. Not tried anything to make better. Denies any NSAID usage.     Review of Systems See HPI.     Objective:   Physical Exam  Constitutional: She is oriented to person, place, and time. She appears well-developed and well-nourished.  HENT:  Head: Normocephalic and atraumatic.  Cardiovascular: Normal rate and regular rhythm.  Pulmonary/Chest: Effort normal and breath sounds normal.  Neurological: She is alert and oriented to person, place, and time.  Skin:  Bruising diffusely over bilateral lower extremities.   Psychiatric: She has a normal mood and affect. Her behavior is normal.          Assessment & Plan:  Marland KitchenMarland KitchenDiagnoses and all orders for this visit:  Binge eating disorder -     lisdexamfetamine (VYVANSE) 30 MG capsule; Take 1 capsule (30 mg total) by mouth daily. -     lisdexamfetamine (VYVANSE) 30 MG capsule; Take 1 capsule (30 mg total) by mouth daily. -     lisdexamfetamine (VYVANSE) 30 MG capsule; Take 1 capsule (30 mg total) by mouth daily.  Easy bruising -     CBC with Differential/Platelet -     Vitamin D 1,25 dihydroxy -     B12 and Folate Panel -     Fe+TIBC+Fer  Restless leg  syndrome -     COMPLETE METABOLIC PANEL WITH GFR -     TSH  Screening for lipid disorders -     Lipid Panel w/reflex Direct LDL  Screening for diabetes mellitus -     COMPLETE METABOLIC PANEL WITH GFR  Low TSH level -     TSH   Binge eating has improved significantly. She does note she has felt more impulses lately to binge eat and has started snacking more over the last week. Increased vyvanse to 30mg . Follow up in 3 months.   Labs ordered for easy bruising. Discussed no warning signs noted for concerns.   Seems like has symptoms of restless legs. Will consider increase iron in diet or medication in the future. HO given for symptomatic care.   Hx of TSH being low. Will recheck.   Fasting labs ordered.

## 2018-09-22 NOTE — Patient Instructions (Signed)

## 2018-09-23 NOTE — Progress Notes (Signed)
Call pt: cholesterol looks great.  Kidney, liver, glucose looks good  Thyroid back in normal range.  Iron levels normal but low side of normal. Start ferrous sulfate 325mg  daily with breakfast.  B12 looks great.

## 2018-09-24 LAB — VITAMIN D 1,25 DIHYDROXY
VITAMIN D 1, 25 (OH) TOTAL: 38 pg/mL (ref 18–72)
VITAMIN D3 1, 25 (OH): 38 pg/mL
Vitamin D2 1, 25 (OH)2: <8 pg/mL

## 2018-09-24 LAB — IRON,TIBC AND FERRITIN PANEL
%SAT: 28 % (ref 16–45)
Ferritin: 35 ng/mL (ref 16–154)
Iron: 69 ug/dL (ref 40–190)
TIBC: 250 ug/dL (ref 250–450)

## 2018-09-24 LAB — CBC WITH DIFFERENTIAL/PLATELET
BASOS ABS: 19 {cells}/uL (ref 0–200)
Basophils Relative: 0.3 %
EOS PCT: 1.3 %
Eosinophils Absolute: 83 cells/uL (ref 15–500)
HCT: 38 % (ref 35.0–45.0)
Hemoglobin: 13 g/dL (ref 11.7–15.5)
LYMPHS ABS: 2170 {cells}/uL (ref 850–3900)
MCH: 30.1 pg (ref 27.0–33.0)
MCHC: 34.2 g/dL (ref 32.0–36.0)
MCV: 88 fL (ref 80.0–100.0)
MPV: 11.8 fL (ref 7.5–12.5)
Monocytes Relative: 6.6 %
NEUTROS PCT: 57.9 %
Neutro Abs: 3706 cells/uL (ref 1500–7800)
Platelets: 274 10*3/uL (ref 140–400)
RBC: 4.32 10*6/uL (ref 3.80–5.10)
RDW: 12.1 % (ref 11.0–15.0)
Total Lymphocyte: 33.9 %
WBC mixed population: 422 cells/uL (ref 200–950)
WBC: 6.4 10*3/uL (ref 3.8–10.8)

## 2018-09-24 LAB — B12 AND FOLATE PANEL
Folate: 5.3 ng/mL — ABNORMAL LOW
VITAMIN B 12: 678 pg/mL (ref 200–1100)

## 2018-09-24 NOTE — Progress Notes (Signed)
Vitamin D in normal range at 38. What does of vitamin D are you taking? I would keep it up.

## 2018-09-25 NOTE — Progress Notes (Signed)
Ok please add vitamin D 5000 units to med list. Continue on this dosage.

## 2018-11-10 ENCOUNTER — Ambulatory Visit: Payer: BLUE CROSS/BLUE SHIELD | Admitting: Sports Medicine

## 2018-11-10 ENCOUNTER — Encounter: Payer: Self-pay | Admitting: Sports Medicine

## 2018-11-10 DIAGNOSIS — J0100 Acute maxillary sinusitis, unspecified: Secondary | ICD-10-CM | POA: Diagnosis not present

## 2018-11-10 DIAGNOSIS — J209 Acute bronchitis, unspecified: Secondary | ICD-10-CM | POA: Insufficient documentation

## 2018-11-10 MED ORDER — PREDNISONE 50 MG PO TABS: ORAL_TABLET | ORAL | 0 refills | Status: DC

## 2018-11-10 MED ORDER — HYDROCOD POLST-CPM POLST ER 10-8 MG/5ML PO SUER: 5.0000 mL | Freq: Two times a day (BID) | ORAL | 0 refills | Status: DC | PRN

## 2018-11-10 MED ORDER — AZITHROMYCIN 250 MG PO TABS: ORAL_TABLET | ORAL | 0 refills | Status: DC

## 2018-11-10 NOTE — Progress Notes (Signed)
Subjective:    CC: Feeling sick  HPI: For 2 weeks now this pleasant 37 year old female has had a minimally productive cough without hemoptysis, facial pain and pressure with radiation to the ears, no fevers, she is having chills, no GI symptoms, no skin rash.  Symptoms are moderate, persistent.  I reviewed the past medical history, family history, social history, surgical history, and allergies today and no changes were needed.  Please see the problem list section below in epic for further details.  Past Medical History: Past Medical History:  Diagnosis Date  . IBS (irritable bowel syndrome)    Past Surgical History: Past Surgical History:  Procedure Laterality Date  . removal of cervical CA     Social History: Social History   Socioeconomic History  . Marital status: Married    Spouse name: Not on file  . Number of children: Not on file  . Years of education: Not on file  . Highest education level: Not on file  Occupational History  . Not on file  Social Needs  . Financial resource strain: Not on file  . Food insecurity:    Worry: Not on file    Inability: Not on file  . Transportation needs:    Medical: Not on file    Non-medical: Not on file  Tobacco Use  . Smoking status: Former Games developermoker  . Smokeless tobacco: Never Used  Substance and Sexual Activity  . Alcohol use: Yes  . Drug use: No  . Sexual activity: Yes  Lifestyle  . Physical activity:    Days per week: Not on file    Minutes per session: Not on file  . Stress: Not on file  Relationships  . Social connections:    Talks on phone: Not on file    Gets together: Not on file    Attends religious service: Not on file    Active member of club or organization: Not on file    Attends meetings of clubs or organizations: Not on file    Relationship status: Not on file  Other Topics Concern  . Not on file  Social History Narrative  . Not on file   Family History: Family History  Problem Relation Age of  Onset  . Hyperlipidemia Father   . Depression Mother   . Depression Paternal Aunt    Allergies: Allergies  Allergen Reactions  . Lexapro [Escitalopram Oxalate]     Not wanting to do anything and not caring about anything  . Paxil [Paroxetine Hcl] Nausea And Vomiting  . Zoloft [Sertraline Hcl] Other (See Comments)    Body shakes   Medications: See med rec.  Review of Systems: No fevers, chills, night sweats, weight loss, chest pain, or shortness of breath.   Objective:    General: Well Developed, well nourished, and in no acute distress.  Neuro: Alert and oriented x3, extra-ocular muscles intact, sensation grossly intact.  HEENT: Normocephalic, atraumatic, pupils equal round reactive to light, neck supple, no masses, no lymphadenopathy, thyroid nonpalpable.  Oropharynx, nasopharynx, ear canals unremarkable, tender to palpation over the maxillary sinuses. Skin: Warm and dry, no rashes. Cardiac: Regular rate and rhythm, no murmurs rubs or gallops, no lower extremity edema.  Respiratory: Clear to auscultation bilaterally. Not using accessory muscles, speaking in full sentences.  Impression and Recommendations:    Acute bronchitis Acute bronchitis with maxillary sinusitis. Greater than 2 weeks of symptoms. Azithromycin, prednisone, Tussionex. Return as needed. ___________________________________________ Ihor Austinhomas J. Benjamin Stainhekkekandam, M.D., ABFM., CAQSM. Primary Care and Sports  Medicine [facility]  Adjunct Professor of Family Medicine  [employer]

## 2018-11-10 NOTE — Assessment & Plan Note (Signed)
Acute bronchitis with maxillary sinusitis. Greater than 2 weeks of symptoms. Azithromycin, prednisone, Tussionex. Return as needed.

## 2018-12-31 ENCOUNTER — Encounter: Payer: Self-pay | Admitting: Physician Assistant

## 2018-12-31 DIAGNOSIS — F5081 Binge eating disorder: Secondary | ICD-10-CM

## 2018-12-31 MED ORDER — LISDEXAMFETAMINE DIMESYLATE 30 MG PO CAPS: 30.0000 mg | ORAL_CAPSULE | Freq: Every day | ORAL | 0 refills | Status: DC

## 2019-01-05 ENCOUNTER — Encounter: Payer: Self-pay | Admitting: Physician Assistant

## 2019-01-05 ENCOUNTER — Ambulatory Visit: Payer: BLUE CROSS/BLUE SHIELD | Admitting: Physician Assistant

## 2019-01-14 ENCOUNTER — Ambulatory Visit: Payer: Self-pay | Admitting: Physician Assistant

## 2019-01-16 ENCOUNTER — Encounter: Payer: Self-pay | Admitting: Physician Assistant

## 2019-01-16 ENCOUNTER — Ambulatory Visit (INDEPENDENT_AMBULATORY_CARE_PROVIDER_SITE_OTHER): Payer: Self-pay | Admitting: Physician Assistant

## 2019-01-16 VITALS — BP 135/78 | HR 78 | Ht 62.0 in | Wt 160.0 lb

## 2019-01-16 DIAGNOSIS — E663 Overweight: Secondary | ICD-10-CM

## 2019-01-16 DIAGNOSIS — F5081 Binge eating disorder: Secondary | ICD-10-CM

## 2019-01-16 MED ORDER — LISDEXAMFETAMINE DIMESYLATE 40 MG PO CAPS: 40.0000 mg | ORAL_CAPSULE | ORAL | 0 refills | Status: DC

## 2019-01-16 NOTE — Progress Notes (Signed)
Subjective:    Patient ID: Lauren Wade, female    DOB: Dec 14, 1980, 38 y.o.   MRN: [id number]  HPI  Lauren Wade presents today for a med check follow up. She has binge eating disorder and was started on Vyvanse. She is doing great on it. She states she feels much more productive and like she is able to complete all her tasks around the house. She feels like at the end of the day she is now finding time to work on her weight by exercising. She state she has also seen an improvement in her anxiety and depression since she can complete all her tasks during the day and not feel as though she isn't accomplishing anything. She also states she is losing weight and feels positive about that. She notices she is not binge eating and is more conscious of what she put in her body. Lost 27lbs.   She did ask about potentially increasing her dose. She feels that over the past two weeks while it is still working that she has begun to graze and eat a little more.   .. Active Ambulatory Problems    Diagnosis Date Noted  . GAD (generalized anxiety disorder) 01/07/2015  . Cervical dysplasia 01/07/2015  . Abnormal weight gain 01/07/2015  . Right shoulder pain 01/11/2015  . Rectocele 02/11/2018  . Epigastric pain 02/11/2018  . Black stools 02/11/2018  . Chronic idiopathic constipation 02/14/2018  . Low TSH level 02/19/2018  . Primary insomnia 03/17/2018  . Chronic gastritis without bleeding 03/17/2018  . Depressed mood 03/17/2018  . Right kidney stone 07/17/2018  . Mood changes 07/17/2018  . Binge eating disorder 08/25/2018  . Restless leg syndrome 09/22/2018  . Easy bruising 09/22/2018  . Acute bronchitis 11/10/2018  . Overweight (BMI 25.0-29.9) 01/16/2019   Resolved Ambulatory Problems    Diagnosis Date Noted  . BRONCHITIS 01/21/2010  . Class 2 obesity due to excess calories without serious comorbidity with body mass index (BMI) of 36.0 to 36.9 in adult 01/07/2015  . RUQ pain 05/05/2015  .  Irritability and anger 07/17/2018   Past Medical History:  Diagnosis Date  . IBS (irritable bowel syndrome)      Review of Systems See HPI    Objective:   Physical Exam Constitutional:      Appearance: Normal appearance.  HENT:     Head: Normocephalic and atraumatic.     Nose: Nose normal.  Cardiovascular:     Rate and Rhythm: Normal rate and regular rhythm.  Pulmonary:     Effort: Pulmonary effort is normal.     Breath sounds: Normal breath sounds.  Neurological:     Mental Status: She is alert and oriented to person, place, and time.  Psychiatric:        Mood and Affect: Mood normal.        Behavior: Behavior normal.           Assessment & Plan:  Marland KitchenMarland KitchenAmber was seen today for follow-up.  Diagnoses and all orders for this visit:  Binge eating disorder -     lisdexamfetamine (VYVANSE) 40 MG capsule; Take 1 capsule (40 mg total) by mouth every morning. -     lisdexamfetamine (VYVANSE) 40 MG capsule; Take 1 capsule (40 mg total) by mouth every morning. -     lisdexamfetamine (VYVANSE) 40 MG capsule; Take 1 capsule (40 mg total) by mouth every morning.  Overweight (BMI 25.0-29.9) -     lisdexamfetamine (VYVANSE) 40 MG capsule; Take 1  capsule (40 mg total) by mouth every morning. -     lisdexamfetamine (VYVANSE) 40 MG capsule; Take 1 capsule (40 mg total) by mouth every morning. -     lisdexamfetamine (VYVANSE) 40 MG capsule; Take 1 capsule (40 mg total) by mouth every morning.   Lost 27lbs. Doing great. I did increase today but encouraged to watch for side effects such as insomnia, tachycardia. Keep doing the right things(exercise and diet).   ..I, Tandy Gaw PA-C, have reviewed and agree with the above documentation in it's entirety.

## 2019-04-15 ENCOUNTER — Encounter: Payer: Self-pay | Admitting: Physician Assistant

## 2019-04-15 MED ORDER — AMPHETAMINE-DEXTROAMPHET ER 20 MG PO CP24: 20.0000 mg | ORAL_CAPSULE | Freq: Every day | ORAL | 0 refills | Status: DC

## 2019-07-06 ENCOUNTER — Encounter: Payer: Self-pay | Admitting: Physician Assistant

## 2019-07-08 MED ORDER — AMPHETAMINE-DEXTROAMPHET ER 20 MG PO CP24: 20.0000 mg | ORAL_CAPSULE | Freq: Every day | ORAL | 0 refills | Status: DC

## 2019-08-17 ENCOUNTER — Encounter: Payer: Self-pay | Admitting: Physician Assistant

## 2019-08-19 ENCOUNTER — Encounter: Payer: Medicaid Other | Admitting: Physician Assistant

## 2019-08-20 NOTE — Progress Notes (Signed)
This encounter was created in error - please disregard.

## 2019-08-31 ENCOUNTER — Telehealth (INDEPENDENT_AMBULATORY_CARE_PROVIDER_SITE_OTHER): Payer: Medicaid Other | Admitting: Physician Assistant

## 2019-08-31 ENCOUNTER — Encounter: Payer: Self-pay | Admitting: Physician Assistant

## 2019-08-31 VITALS — BP 109/82 | HR 86 | Ht 62.0 in | Wt 169.0 lb

## 2019-08-31 DIAGNOSIS — F5081 Binge eating disorder: Secondary | ICD-10-CM

## 2019-08-31 DIAGNOSIS — J40 Bronchitis, not specified as acute or chronic: Secondary | ICD-10-CM

## 2019-08-31 DIAGNOSIS — J329 Chronic sinusitis, unspecified: Secondary | ICD-10-CM

## 2019-08-31 MED ORDER — AMPHETAMINE-DEXTROAMPHET ER 30 MG PO CP24: 30.0000 mg | ORAL_CAPSULE | ORAL | 0 refills | Status: DC

## 2019-08-31 MED ORDER — AZITHROMYCIN 250 MG PO TABS: ORAL_TABLET | ORAL | 0 refills | Status: DC

## 2019-08-31 MED ORDER — ALBUTEROL SULFATE HFA 108 (90 BASE) MCG/ACT IN AERS: 2.0000 | INHALATION_SPRAY | Freq: Four times a day (QID) | RESPIRATORY_TRACT | 0 refills | Status: DC | PRN

## 2019-08-31 NOTE — Progress Notes (Deleted)
Sore throat and Cough - started Thursday  Dry and productive Has congestion No SOB  No known exposure to Covid

## 2019-08-31 NOTE — Progress Notes (Signed)
Patient ID: Lauren Wade, female   DOB: 08-14-1981, 38 y.o.   MRN: [id number] .Marland KitchenVirtual Visit via Video Note  I connected with Jendaya M Villela on 08/31/19 at 10:30 AM EDT by a video enabled telemedicine application and verified that I am speaking with the correct person using two identifiers.  Location: Patient: home Provider: clinic   I discussed the limitations of evaluation and management by telemedicine and the availability of in person appointments. The patient expressed understanding and agreed to proceed.  History of Present Illness: Patient is a 38 year old female with binge eating disorde who calls into the clinic for refills.  She is doing well on Adderall.  She can afford Adderall more than Vyvanse at this time.  She feels like for the most part it is managing her appetite well.  She denies any increase in anxiety, palpitations, headaches, or insomnia.  She has been out of Adderall and noticing that her impulses for food are slowly coming back.  For the last 7 days patient has had cold-like symptoms.  She feels like this is her typical sinus cold symptoms.  She does feel like it could be going into her chest some.  She denies any fever, chills, loss of smell or taste, GI symptoms, shortness of breath.  She does have a mild cough associated with sinus drainage.  She does have some sinus and nasal congestion and pressure.  She has been taking Tylenol Cold and sinus severe with some relief but her symptoms still continue to be persistent.  She denies any known direct COVID contact.  She denies any sick contacts.  .. Active Ambulatory Problems    Diagnosis Date Noted  . GAD (generalized anxiety disorder) 01/07/2015  . Cervical dysplasia 01/07/2015  . Abnormal weight gain 01/07/2015  . Right shoulder pain 01/11/2015  . Rectocele 02/11/2018  . Epigastric pain 02/11/2018  . Black stools 02/11/2018  . Chronic idiopathic constipation 02/14/2018  . Low TSH level 02/19/2018  .  Primary insomnia 03/17/2018  . Chronic gastritis without bleeding 03/17/2018  . Depressed mood 03/17/2018  . Right kidney stone 07/17/2018  . Mood changes 07/17/2018  . Binge eating disorder 08/25/2018  . Restless leg syndrome 09/22/2018  . Easy bruising 09/22/2018  . Acute bronchitis 11/10/2018  . Overweight (BMI 25.0-29.9) 01/16/2019   Resolved Ambulatory Problems    Diagnosis Date Noted  . BRONCHITIS 01/21/2010  . Class 2 obesity due to excess calories without serious comorbidity with body mass index (BMI) of 36.0 to 36.9 in adult 01/07/2015  . RUQ pain 05/05/2015  . Irritability and anger 07/17/2018   Past Medical History:  Diagnosis Date  . IBS (irritable bowel syndrome)    Reviewed med, allergy, problem list.     Observations/Objective: No acute distress. No labored breathing.  Cleared throat but no cough.  Normal appearance and mood.   .. Today's Vitals   08/31/19 1025  Weight: 169 lb (76.7 kg)  Height: 5\' 2"  (1.575 m)   Body mass index is 30.91 kg/m.    Assessment and Plan: Marland KitchenMarland KitchenAmber was seen today for cough.  Diagnoses and all orders for this visit:  Binge eating disorder -     amphetamine-dextroamphetamine (ADDERALL XR) 30 MG 24 hr capsule; Take 1 capsule (30 mg total) by mouth every morning. -     amphetamine-dextroamphetamine (ADDERALL XR) 30 MG 24 hr capsule; Take 1 capsule (30 mg total) by mouth every morning. -     amphetamine-dextroamphetamine (ADDERALL XR) 30 MG 24 hr capsule;  Take 1 capsule (30 mg total) by mouth every morning.  Sinobronchitis -     azithromycin (ZITHROMAX) 250 MG tablet; Take 2 tablets now and then one tablet for 4 days. -     albuterol (VENTOLIN HFA) 108 (90 Base) MCG/ACT inhaler; Inhale 2 puffs into the lungs every 6 (six) hours as needed for wheezing or shortness of breath.   Refilled Adderall for 3 months.  Patient will call with blood pressure reading when she goes to her mother's house tomorrow.  We will treat for  sinobronchitis.  She has a history of this almost every year around this time.  Albuterol given for chest congestion and tightness.  Z-Pak for 5 days.  Rest and hydrate.  If she has any more symptoms and/or does not feel better suggested COVID testing. Pt is to self isolate for next 3 days to see if symptoms persist.     Follow Up Instructions:    I discussed the assessment and treatment plan with the patient. The patient was provided an opportunity to ask questions and all were answered. The patient agreed with the plan and demonstrated an understanding of the instructions.   The patient was advised to call back or seek an in-person evaluation if the symptoms worsen or if the condition fails to improve as anticipated.   Tandy Gaw, PA-C

## 2019-09-01 ENCOUNTER — Encounter: Payer: Self-pay | Admitting: Physician Assistant

## 2019-09-01 ENCOUNTER — Ambulatory Visit: Payer: Medicaid Other | Admitting: Physician Assistant

## 2019-09-01 ENCOUNTER — Telehealth: Payer: Self-pay | Admitting: Neurology

## 2019-09-01 NOTE — Telephone Encounter (Signed)
Called patient and LMOM for her to call back with recent blood pressure reading. Awaiting call back.

## 2019-09-01 NOTE — Telephone Encounter (Signed)
Patient sent reading via mychart.

## 2020-03-21 ENCOUNTER — Other Ambulatory Visit: Payer: Self-pay

## 2020-03-21 ENCOUNTER — Encounter: Payer: Self-pay | Admitting: Physician Assistant

## 2020-03-21 ENCOUNTER — Ambulatory Visit (INDEPENDENT_AMBULATORY_CARE_PROVIDER_SITE_OTHER): Payer: Self-pay | Admitting: Physician Assistant

## 2020-03-21 VITALS — BP 121/74 | HR 79 | Temp 98.5°F | Ht 62.0 in | Wt 198.0 lb

## 2020-03-21 DIAGNOSIS — E6609 Other obesity due to excess calories: Secondary | ICD-10-CM

## 2020-03-21 DIAGNOSIS — Z6836 Body mass index (BMI) 36.0-36.9, adult: Secondary | ICD-10-CM

## 2020-03-21 DIAGNOSIS — Z111 Encounter for screening for respiratory tuberculosis: Secondary | ICD-10-CM

## 2020-03-21 DIAGNOSIS — Z131 Encounter for screening for diabetes mellitus: Secondary | ICD-10-CM

## 2020-03-21 DIAGNOSIS — Z0000 Encounter for general adult medical examination without abnormal findings: Secondary | ICD-10-CM | POA: Insufficient documentation

## 2020-03-21 MED ORDER — PHENTERMINE HCL 37.5 MG PO TABS: 37.5000 mg | ORAL_TABLET | Freq: Every day | ORAL | 0 refills | Status: DC

## 2020-03-21 NOTE — Progress Notes (Signed)
Subjective:     Lauren Wade is a 39 y.o. female and is here for a comprehensive physical exam. The patient reports problems - weight gain of 30lbs in the last year.  due to insurance had to come off vyvanse for her binge eating and weight came back on.   Social History   Socioeconomic History  . Marital status: Married    Spouse name: Not on file  . Number of children: Not on file  . Years of education: Not on file  . Highest education level: Not on file  Occupational History  . Not on file  Tobacco Use  . Smoking status: Former Research scientist (life sciences)  . Smokeless tobacco: Never Used  Substance and Sexual Activity  . Alcohol use: Yes  . Drug use: No  . Sexual activity: Yes  Other Topics Concern  . Not on file  Social History Narrative  . Not on file   Social Determinants of Health   Financial Resource Strain:   . Difficulty of Paying Living Expenses:   Food Insecurity:   . Worried About Charity fundraiser in the Last Year:   . Arboriculturist in the Last Year:   Transportation Needs:   . Film/video editor (Medical):   Marland Kitchen Lack of Transportation (Non-Medical):   Physical Activity:   . Days of Exercise per Week:   . Minutes of Exercise per Session:   Stress:   . Feeling of Stress :   Social Connections:   . Frequency of Communication with Friends and Family:   . Frequency of Social Gatherings with Friends and Family:   . Attends Religious Services:   . Active Member of Clubs or Organizations:   . Attends Archivist Meetings:   Marland Kitchen Marital Status:   Intimate Partner Violence:   . Fear of Current or Ex-Partner:   . Emotionally Abused:   Marland Kitchen Physically Abused:   . Sexually Abused:    Health Maintenance  Topic Date Due  . COVID-19 Vaccine (1) Never done  . HIV Screening  03/21/2021 (Originally 01/23/1997)  . INFLUENZA VACCINE  07/03/2020  . PAP SMEAR-Modifier  03/17/2021  . TETANUS/TDAP  01/23/2029    The following portions of the patient's history were  reviewed and updated as appropriate: allergies, current medications, past family history, past medical history, past social history, past surgical history and problem list.  Review of Systems Pertinent items noted in HPI and remainder of comprehensive ROS otherwise negative.   Objective:    BP 121/74   Pulse 79   Temp 98.5 F (36.9 C) (Oral)   Ht 5\' 2"  (1.575 m)   Wt 198 lb (89.8 kg)   SpO2 100%   BMI 36.21 kg/m  General appearance: alert, cooperative, appears stated age and moderately obese Head: Normocephalic, without obvious abnormality, atraumatic Eyes: conjunctivae/corneas clear. PERRL, EOM's intact. Fundi benign. Ears: normal TM's and external ear canals both ears Nose: Nares normal. Septum midline. Mucosa normal. No drainage or sinus tenderness. Throat: lips, mucosa, and tongue normal; teeth and gums normal Neck: no adenopathy, no carotid bruit, no JVD, supple, symmetrical, trachea midline and thyroid not enlarged, symmetric, no tenderness/mass/nodules Back: symmetric, no curvature. ROM normal. No CVA tenderness. Lungs: clear to auscultation bilaterally Heart: regular rate and rhythm, S1, S2 normal, no murmur, click, rub or gallop Abdomen: soft, non-tender; bowel sounds normal; no masses,  no organomegaly Extremities: extremities normal, atraumatic, no cyanosis or edema Pulses: 2+ and symmetric Skin: Skin color, texture, turgor  normal. No rashes or lesions Lymph nodes: Cervical, supraclavicular, and axillary nodes normal. Neurologic: Alert and oriented X 3, normal strength and tone. Normal symmetric reflexes. Normal coordination and gait    Assessment:    Healthy female exam.      Plan:    Marland KitchenMarland KitchenAmber was seen today for annual exam.  Diagnoses and all orders for this visit:  Routine physical examination  Class 2 obesity due to excess calories without serious comorbidity with body mass index (BMI) of 36.0 to 36.9 in adult -     phentermine (ADIPEX-P) 37.5 MG tablet;  Take 1 tablet (37.5 mg total) by mouth daily before breakfast. -     COMPLETE METABOLIC PANEL WITH GFR  Screening for diabetes mellitus  Tuberculosis screening -     TB Skin Test   .. Depression screen [facility] 2/9 03/21/2020 01/16/2019 07/15/2018 05/20/2018 03/17/2018  Decreased Interest 2 1 2 1 2   Down, Depressed, Hopeless 1 1 2 2 2   PHQ - 2 Score 3 2 4 3 4   Altered sleeping 1 3 1 2 3   Tired, decreased energy 3 2 2 3 3   Change in appetite 3 1 3 3 3   Feeling bad or failure about yourself  3 1 2 2 1   Trouble concentrating 1 1 3 3 2   Moving slowly or fidgety/restless 0 0 0 0 0  Suicidal thoughts 0 0 0 0 0  PHQ-9 Score 14 10 15 16 16   Difficult doing work/chores Somewhat difficult Somewhat difficult Very difficult Somewhat difficult Somewhat difficult   . Discussed 150 minutes of exercise a week.  Encouraged vitamin D 1000 units and Calcium 1300mg  or 4 servings of dairy a day.  Pap UTD.  Fasting labs ordered  TB screen placed for work.   .Discussed low carb diet with 1500 calories and 80g of protein.  Exercising at least 150 minutes a week.  My Fitness Pal could be a .  Started phentermine for jump start. Discussed IF 16:8.  Consider vyvanse in future for binge eating.   Per patient mood is around weight gain. She feels like if she loses weight her depressed mood will change.  Follow up in 3 months.  See After Visit Summary for Counseling Recommendations

## 2020-03-21 NOTE — Patient Instructions (Signed)
Health Maintenance, Female Adopting a healthy lifestyle and getting preventive care are important in promoting health and wellness. Ask your health care provider about:  The right schedule for you to have regular tests and exams.  Things you can do on your own to prevent diseases and keep yourself healthy. What should I know about diet, weight, and exercise? Eat a healthy diet   Eat a diet that includes plenty of vegetables, fruits, low-fat dairy products, and lean protein.  Do not eat a lot of foods that are high in solid fats, added sugars, or sodium. Maintain a healthy weight Body mass index (BMI) is used to identify weight problems. It estimates body fat based on height and weight. Your health care provider can help determine your BMI and help you achieve or maintain a healthy weight. Get regular exercise Get regular exercise. This is one of the most important things you can do for your health. Most adults should:  Exercise for at least 150 minutes each week. The exercise should increase your heart rate and make you sweat (moderate-intensity exercise).  Do strengthening exercises at least twice a week. This is in addition to the moderate-intensity exercise.  Spend less time sitting. Even light physical activity can be beneficial. Watch cholesterol and blood lipids Have your blood tested for lipids and cholesterol at 38 years of age, then have this test every 5 years. Have your cholesterol levels checked more often if:  Your lipid or cholesterol levels are high.  You are older than 38 years of age.  You are at high risk for heart disease. What should I know about cancer screening? Depending on your health history and family history, you may need to have cancer screening at various ages. This may include screening for:  Breast cancer.  Cervical cancer.  Colorectal cancer.  Skin cancer.  Lung cancer. What should I know about heart disease, diabetes, and high blood  pressure? Blood pressure and heart disease  High blood pressure causes heart disease and increases the risk of stroke. This is more likely to develop in people who have high blood pressure readings, are of African descent, or are overweight.  Have your blood pressure checked: ? Every 3-5 years if you are 18-39 years of age. ? Every year if you are 40 years old or older. Diabetes Have regular diabetes screenings. This checks your fasting blood sugar level. Have the screening done:  Once every three years after age 40 if you are at a normal weight and have a low risk for diabetes.  More often and at a younger age if you are overweight or have a high risk for diabetes. What should I know about preventing infection? Hepatitis B If you have a higher risk for hepatitis B, you should be screened for this virus. Talk with your health care provider to find out if you are at risk for hepatitis B infection. Hepatitis C Testing is recommended for:  Everyone born from 1945 through 1965.  Anyone with known risk factors for hepatitis C. Sexually transmitted infections (STIs)  Get screened for STIs, including gonorrhea and chlamydia, if: ? You are sexually active and are younger than 38 years of age. ? You are older than 38 years of age and your health care provider tells you that you are at risk for this type of infection. ? Your sexual activity has changed since you were last screened, and you are at increased risk for chlamydia or gonorrhea. Ask your health care provider if   you are at risk.  Ask your health care provider about whether you are at high risk for HIV. Your health care provider may recommend a prescription medicine to help prevent HIV infection. If you choose to take medicine to prevent HIV, you should first get tested for HIV. You should then be tested every 3 months for as long as you are taking the medicine. Pregnancy  If you are about to stop having your period (premenopausal) and  you may become pregnant, seek counseling before you get pregnant.  Take 400 to 800 micrograms (mcg) of folic acid every day if you become pregnant.  Ask for birth control (contraception) if you want to prevent pregnancy. Osteoporosis and menopause Osteoporosis is a disease in which the bones lose minerals and strength with aging. This can result in bone fractures. If you are 65 years old or older, or if you are at risk for osteoporosis and fractures, ask your health care provider if you should:  Be screened for bone loss.  Take a calcium or vitamin D supplement to lower your risk of fractures.  Be given hormone replacement therapy (HRT) to treat symptoms of menopause. Follow these instructions at home: Lifestyle  Do not use any products that contain nicotine or tobacco, such as cigarettes, e-cigarettes, and chewing tobacco. If you need help quitting, ask your health care provider.  Do not use street drugs.  Do not share needles.  Ask your health care provider for help if you need support or information about quitting drugs. Alcohol use  Do not drink alcohol if: ? Your health care provider tells you not to drink. ? You are pregnant, may be pregnant, or are planning to become pregnant.  If you drink alcohol: ? Limit how much you use to 0-1 drink a day. ? Limit intake if you are breastfeeding.  Be aware of how much alcohol is in your drink. In the [place] one drink equals one 12 oz bottle of beer (355 mL), one 5 oz glass of wine (148 mL), or one 1 oz glass of hard liquor (44 mL). General instructions  Schedule regular health, dental, and eye exams.  Stay current with your vaccines.  Tell your health care provider if: ? You often feel depressed. ? You have ever been abused or do not feel safe at home. Summary  Adopting a healthy lifestyle and getting preventive care are important in promoting health and wellness.  Follow your health care provider's instructions about healthy  diet, exercising, and getting tested or screened for diseases.  Follow your health care provider's instructions on monitoring your cholesterol and blood pressure. This information is not intended to replace advice given to you by your health care provider. Make sure you discuss any questions you have with your health care provider. Document Revised: 11/12/2018 Document Reviewed: 11/12/2018 Elsevier Patient Education  2020 Elsevier Inc.  

## 2020-03-23 ENCOUNTER — Ambulatory Visit (INDEPENDENT_AMBULATORY_CARE_PROVIDER_SITE_OTHER): Payer: Self-pay | Admitting: Physician Assistant

## 2020-03-23 ENCOUNTER — Other Ambulatory Visit: Payer: Self-pay

## 2020-03-23 VITALS — BP 124/80 | HR 64

## 2020-03-23 DIAGNOSIS — Z111 Encounter for screening for respiratory tuberculosis: Secondary | ICD-10-CM

## 2020-03-23 NOTE — Progress Notes (Signed)
Patient presents today for PPD reading. PPD was placed on 03/21/2020 in right forearm.   Results: negative 0 induration

## 2020-03-25 ENCOUNTER — Telehealth: Payer: Self-pay | Admitting: Neurology

## 2020-03-25 DIAGNOSIS — J329 Chronic sinusitis, unspecified: Secondary | ICD-10-CM

## 2020-03-25 MED ORDER — AZITHROMYCIN 250 MG PO TABS: ORAL_TABLET | ORAL | 0 refills | Status: DC

## 2020-03-25 NOTE — Telephone Encounter (Signed)
Spoke with SaraBeth and she agrees to send in [facility]. Patient has used in the past and it has worked well for her. Also let her know she could use nasal sprays OTC. She expressed appreciation and will let us know if this does not help. RX sent. [facility] Dec - FYI.

## 2020-03-25 NOTE — Telephone Encounter (Signed)
Script signed.

## 2020-03-25 NOTE — Telephone Encounter (Signed)
Patient left a VM stating she is having increased sinus issues since Monday. She was told to call if getting worse and Lesly Rubenstein would call something in for her. She states has had a sinus headache since Monday and increased drainage. Please advise. Uses [facility].

## 2020-05-17 ENCOUNTER — Other Ambulatory Visit: Payer: Self-pay

## 2020-05-17 ENCOUNTER — Ambulatory Visit (INDEPENDENT_AMBULATORY_CARE_PROVIDER_SITE_OTHER): Payer: Self-pay | Admitting: Physician Assistant

## 2020-05-17 VITALS — BP 122/82 | HR 102 | Temp 98.6°F | Ht 62.0 in | Wt 193.0 lb

## 2020-05-17 DIAGNOSIS — H938X1 Other specified disorders of right ear: Secondary | ICD-10-CM

## 2020-05-17 DIAGNOSIS — R635 Abnormal weight gain: Secondary | ICD-10-CM

## 2020-05-17 DIAGNOSIS — K148 Other diseases of tongue: Secondary | ICD-10-CM

## 2020-05-17 MED ORDER — FLUCONAZOLE 150 MG PO TABS: 150.0000 mg | ORAL_TABLET | Freq: Once | ORAL | 0 refills | Status: AC

## 2020-05-17 MED ORDER — NYSTATIN 100000 UNIT/ML MT SUSP: 500000.0000 [IU] | Freq: Four times a day (QID) | OROMUCOSAL | 0 refills | Status: DC

## 2020-05-17 NOTE — Progress Notes (Addendum)
Subjective:    Patient ID: Lauren Wade, female    DOB: 1981-07-26, 39 y.o.   MRN: [id number]  HPI  Lauren Wade is a 39 year old female presenting for discoloration of her tongue.   She states that her tongue has been orange since last week. She states that the discoloration is present in the morning, she will use mouthwash and a tongue scraper to remove, and it will return in the evening. She states when she scrapes her tongue she has some mild bleeding. She admits to associated dry mouth, sore throat, ear pain and fullness. She denies fevers, although she states she has been feeling warmer.   She denies any significant diet changes since the onset of discoloration. She states she started a probiotic last week but has since stopped. She denies starting any new medications or antibiotics. She denies any coffee, dark drink consumption. She states that he intermittently gets heartburn. She states she has been taking CBD and apple cider vinegar gummies for months but has stopped since the discoloration has started. She denies smoking.   Pt also continues to have difficulty with weight loss. She states she stopped taking  phentermine due to chest pain and palpitations. She admits she has been trying a nutrition app called noom and she has significantly improved her diet the last few months, but cannot lose weight.   .. Active Ambulatory Problems    Diagnosis Date Noted  . GAD (generalized anxiety disorder) 01/07/2015  . Cervical dysplasia 01/07/2015  . Abnormal weight gain 01/07/2015  . Class 2 obesity due to excess calories without serious comorbidity with body mass index (BMI) of 36.0 to 36.9 in adult 01/07/2015  . Right shoulder pain 01/11/2015  . Rectocele 02/11/2018  . Epigastric pain 02/11/2018  . Black stools 02/11/2018  . Chronic idiopathic constipation 02/14/2018  . Low TSH level 02/19/2018  . Primary insomnia 03/17/2018  . Chronic gastritis without bleeding 03/17/2018  . Depressed  mood 03/17/2018  . Right kidney stone 07/17/2018  . Mood changes 07/17/2018  . Binge eating disorder 08/25/2018  . Restless leg syndrome 09/22/2018  . Easy bruising 09/22/2018  . Acute bronchitis 11/10/2018  . Overweight (BMI 25.0-29.9) 01/16/2019  . Routine physical examination 03/21/2020  . Abnormal color of tongue 05/20/2020  . Ear pressure, right 05/20/2020   Resolved Ambulatory Problems    Diagnosis Date Noted  . BRONCHITIS 01/21/2010  . RUQ pain 05/05/2015  . Irritability and anger 07/17/2018   Past Medical History:  Diagnosis Date  . IBS (irritable bowel syndrome)       Review of Systems  Constitutional: Negative for chills and fatigue.  HENT: Positive for congestion, dental problem (orange tongue), ear pain and sore throat. Negative for hearing loss, mouth sores, rhinorrhea and sinus pain.   Eyes: Negative for pain and redness.  Respiratory: Negative.   Cardiovascular: Positive for palpitations (with phentermine).  Gastrointestinal: Positive for constipation. Negative for nausea and vomiting.  Endocrine: Negative.   Genitourinary: Negative.   Skin: Negative.   Neurological: Negative.   Psychiatric/Behavioral: Negative.      .. Patient Active Problem List   Diagnosis Date Noted  . Abnormal color of tongue 05/20/2020  . Ear pressure, right 05/20/2020  . Routine physical examination 03/21/2020  . Overweight (BMI 25.0-29.9) 01/16/2019  . Acute bronchitis 11/10/2018  . Restless leg syndrome 09/22/2018  . Easy bruising 09/22/2018  . Binge eating disorder 08/25/2018  . Right kidney stone 07/17/2018  . Mood changes 07/17/2018  .  Primary insomnia 03/17/2018  . Chronic gastritis without bleeding 03/17/2018  . Depressed mood 03/17/2018  . Low TSH level 02/19/2018  . Chronic idiopathic constipation 02/14/2018  . Rectocele 02/11/2018  . Epigastric pain 02/11/2018  . Black stools 02/11/2018  . Right shoulder pain 01/11/2015  . GAD (generalized anxiety disorder)  01/07/2015  . Cervical dysplasia 01/07/2015  . Abnormal weight gain 01/07/2015  . Class 2 obesity due to excess calories without serious comorbidity with body mass index (BMI) of 36.0 to 36.9 in adult 01/07/2015   .Marland Kitchen No current outpatient medications on file prior to visit.   No current facility-administered medications on file prior to visit.    ...      Objective:   Physical Exam Constitutional:      Appearance: Normal appearance.  HENT:     Head: Normocephalic and atraumatic.     Right Ear: Tympanic membrane, ear canal and external ear normal.     Left Ear: Tympanic membrane and external ear normal.     Ears:     Comments: Left canal erythematous     Nose: Nose normal.     Mouth/Throat:     Mouth: Mucous membranes are dry.     Pharynx: Posterior oropharyngeal erythema present.     Comments: Tongue was tinted orange to brown in the back 1/3 of tongue.  Eyes:     Extraocular Movements: Extraocular movements intact.     Conjunctiva/sclera: Conjunctivae normal.     Pupils: Pupils are equal, round, and reactive to light.  Neck:     Thyroid: No thyroid mass or thyroid tenderness.  Cardiovascular:     Rate and Rhythm: Normal rate and regular rhythm.     Pulses: Normal pulses.     Heart sounds: Normal heart sounds.  Pulmonary:     Effort: Pulmonary effort is normal.     Breath sounds: Normal breath sounds.  Lymphadenopathy:     Cervical: No cervical adenopathy.  Skin:    General: Skin is warm.  Neurological:     General: No focal deficit present.     Mental Status: She is alert and oriented to person, place, and time.  Psychiatric:        Mood and Affect: Mood normal.        Behavior: Behavior normal.        Assessment & Plan:   Marland KitchenMarland KitchenAmber was seen today for tongue problem.  Diagnoses and all orders for this visit:  Abnormal color of tongue -     fluconazole (DIFLUCAN) 150 MG tablet; Take 1 tablet (150 mg total) by mouth once for 1 dose. Repeat in 48 hours. -      nystatin (MYCOSTATIN) 100000 UNIT/ML suspension; Take 5 mLs (500,000 Units total) by mouth 4 (four) times daily. Swish for 30 seconds and spit out.  Abnormal weight gain  Ear pressure, right    - Considering several causes, most likely oral thrush. Other considerations include geographical tongue, acid reflux, glossitis caused by low B12 or iron, food allergy, Sjogrens.  - Pt to be treated for thrush with Fluconozale, Magic mouthwash.   - Flonase, and Mucinex for congestion and ear fullness.  - Discussed continuing diet changes, proper caloric intake, hydration, and daily exercise. Pt to try low gluten and continuing to avoid processed food. Also discussed intermittent fasting, patient agreeable to try as well.  - Discontinue phentermine due to palpitations and chest pain.  -IF diet discussed 16:8 - Discussed IgG delayed food sensitivity testing. Patient  interested and will think about it.   Patient to return in 2 weeks or sooner if needed.  - Will do lab workup if oral symptoms do not resolve with treatment for Thrush. - Will also discuss if gluten free diet has improved oral symptoms and weight loss.   Spent 30 minutes with patient.   Marland KitchenHarlon Flor PA-C, have reviewed and agree with the above documentation in it's entirety.

## 2020-05-17 NOTE — Patient Instructions (Signed)
Consider labs.  Start medication for thrush.

## 2020-05-20 ENCOUNTER — Encounter: Payer: Self-pay | Admitting: Physician Assistant

## 2020-05-20 DIAGNOSIS — H938X1 Other specified disorders of right ear: Secondary | ICD-10-CM | POA: Insufficient documentation

## 2020-05-20 DIAGNOSIS — K148 Other diseases of tongue: Secondary | ICD-10-CM | POA: Insufficient documentation

## 2020-05-23 ENCOUNTER — Encounter: Payer: Self-pay | Admitting: Physician Assistant

## 2020-06-28 ENCOUNTER — Encounter: Payer: Self-pay | Admitting: Physician Assistant

## 2020-06-29 NOTE — Telephone Encounter (Signed)
Routing to covering provider.  °

## 2020-07-19 ENCOUNTER — Ambulatory Visit: Payer: Medicaid Other | Admitting: Physician Assistant

## 2020-10-24 ENCOUNTER — Telehealth (INDEPENDENT_AMBULATORY_CARE_PROVIDER_SITE_OTHER): Payer: Medicaid Other | Admitting: Physician Assistant

## 2020-10-24 ENCOUNTER — Encounter: Payer: Self-pay | Admitting: Physician Assistant

## 2020-10-24 VITALS — Temp 96.2°F | Ht 62.0 in | Wt 183.0 lb

## 2020-10-24 DIAGNOSIS — J329 Chronic sinusitis, unspecified: Secondary | ICD-10-CM

## 2020-10-24 DIAGNOSIS — J40 Bronchitis, not specified as acute or chronic: Secondary | ICD-10-CM

## 2020-10-24 MED ORDER — AMOXICILLIN-POT CLAVULANATE 875-125 MG PO TABS: 1.0000 | ORAL_TABLET | Freq: Two times a day (BID) | ORAL | 0 refills | Status: DC

## 2020-10-24 MED ORDER — PREDNISONE 50 MG PO TABS: ORAL_TABLET | ORAL | 0 refills | Status: DC

## 2020-10-24 MED ORDER — ALBUTEROL SULFATE HFA 108 (90 BASE) MCG/ACT IN AERS: 2.0000 | INHALATION_SPRAY | Freq: Four times a day (QID) | RESPIRATORY_TRACT | 0 refills | Status: DC | PRN

## 2020-10-24 NOTE — Progress Notes (Signed)
Patient ID: Lauren Wade, female   DOB: 03/30/1981, 39 y.o.   MRN: [id number] .Marland KitchenVirtual Visit via Video Note  I connected with Lauren Wade on 10/24/20 at  8:10 AM EST by a video enabled telemedicine application and verified that I am speaking with the correct person using two identifiers.  Location: Patient: home Provider: clinic   I discussed the limitations of evaluation and management by telemedicine and the availability of in person appointments. The patient expressed understanding and agreed to proceed.  History of Present Illness: Patient is a 39 year old female who calls into the clinic with upper respiratory symptoms that have been going on for the past week. She has been taking over-the-counter medications such as Mucinex and BC powder with little benefit. She continues to have sinus pressure, sinus drainage and congestion with productive green to yellow mucus and scratchy throat. Her cough is dry to productive. She has been tested for Covid and was negative. She is also done some home rapid test which have been negative. She denies any loss of smell or taste, body aches, fever, chills, GI symptoms. No covid contact. Pt is vaccinated.   .. Active Ambulatory Problems    Diagnosis Date Noted  . GAD (generalized anxiety disorder) 01/07/2015  . Cervical dysplasia 01/07/2015  . Abnormal weight gain 01/07/2015  . Class 2 obesity due to excess calories without serious comorbidity with body mass index (BMI) of 36.0 to 36.9 in adult 01/07/2015  . Right shoulder pain 01/11/2015  . Rectocele 02/11/2018  . Epigastric pain 02/11/2018  . Black stools 02/11/2018  . Chronic idiopathic constipation 02/14/2018  . Low TSH level 02/19/2018  . Primary insomnia 03/17/2018  . Chronic gastritis without bleeding 03/17/2018  . Depressed mood 03/17/2018  . Right kidney stone 07/17/2018  . Mood changes 07/17/2018  . Binge eating disorder 08/25/2018  . Restless leg syndrome 09/22/2018  . Easy  bruising 09/22/2018  . Acute bronchitis 11/10/2018  . Overweight (BMI 25.0-29.9) 01/16/2019  . Routine physical examination 03/21/2020  . Abnormal color of tongue 05/20/2020  . Ear pressure, right 05/20/2020   Resolved Ambulatory Problems    Diagnosis Date Noted  . BRONCHITIS 01/21/2010  . RUQ pain 05/05/2015  . Irritability and anger 07/17/2018   Past Medical History:  Diagnosis Date  . IBS (irritable bowel syndrome)    Reviewed med, allergy, problem list.     Observations/Objective: No acute distress Normal mood and appearance.  Normal breathing, not labored, able to speak in full sentences.  Dry to productive cough.   .. Today's Vitals   10/24/20 0755  Temp: (!) 96.2 F (35.7 C)  TempSrc: Oral  Weight: 183 lb (83 kg)  Height: 5\' 2"  (1.575 m)   Body mass index is 33.47 kg/m.     Assessment and Plan: Marland KitchenAmber was seen today for nasal congestion and cough.  Diagnoses and all orders for this visit:  Sinobronchitis -     albuterol (VENTOLIN HFA) 108 (90 Base) MCG/ACT inhaler; Inhale 2 puffs into the lungs every 6 (six) hours as needed. -     amoxicillin-clavulanate (AUGMENTIN) 875-125 MG tablet; Take 1 tablet by mouth 2 (two) times daily. For 10 days. -     predniSONE (DELTASONE) 50 MG tablet; One tab PO daily for 5 days.   Treated with augmentin, albuterol, prednisone. May hold to prednisone for a day or so to see if gets better without it. covid negative. Rest and hydrate. Follow up as needed or if symptoms worsen.  Follow Up Instructions:    I discussed the assessment and treatment plan with the patient. The patient was provided an opportunity to ask questions and all were answered. The patient agreed with the plan and demonstrated an understanding of the instructions.   The patient was advised to call back or seek an in-person evaluation if the symptoms worsen or if the condition fails to improve as anticipated.    Tandy Gaw, PA-C

## 2020-10-24 NOTE — Progress Notes (Deleted)
Started last Saturday (10/15/2020) Congestion Sinus pressure Yellow/green mucous Cough Sore throat  Taking mucinex, BC powder  Has tested negative Covid - last Monday (10/17/2020)

## 2021-01-19 ENCOUNTER — Other Ambulatory Visit: Payer: Self-pay

## 2021-01-19 ENCOUNTER — Encounter: Payer: Self-pay | Admitting: Family Medicine

## 2021-01-19 ENCOUNTER — Ambulatory Visit: Payer: BC Managed Care – PPO | Admitting: Family Medicine

## 2021-01-19 VITALS — BP 125/76 | HR 76 | Wt 164.9 lb

## 2021-01-19 DIAGNOSIS — H6593 Unspecified nonsuppurative otitis media, bilateral: Secondary | ICD-10-CM

## 2021-01-19 DIAGNOSIS — H938X3 Other specified disorders of ear, bilateral: Secondary | ICD-10-CM | POA: Diagnosis not present

## 2021-01-19 DIAGNOSIS — J0101 Acute recurrent maxillary sinusitis: Secondary | ICD-10-CM

## 2021-01-19 MED ORDER — AMOXICILLIN-POT CLAVULANATE 875-125 MG PO TABS: 1.0000 | ORAL_TABLET | Freq: Two times a day (BID) | ORAL | 0 refills | Status: AC

## 2021-01-19 NOTE — Progress Notes (Signed)
Acute Office Visit  Subjective:    Patient ID: Lauren Wade, female    DOB: [birth year], 40 y.o.   MRN: [id number]  Chief Complaint  Patient presents with  . Otitis Media  . Sinusitis    HPI Patient is in today for bilateral ear pressure and sinus symptoms. She reports she moved into a new house earlier this year and has had ongoing allergy and sinus issues ever since. She reports about 4 weeks of sinus pressure, headache, congestion, post-nasal drainage. She tested negative for COVID and flu, but can't seem to get feeling better. About 2 days ago she started to notice ear pressure bilaterally and feeling like she was "underwater." She reports ongoing ear aching (left 4/10, right 4/10) with occasional tinnitus maybe once or twice a week for the past several weeks. She states her ears feel clogged, her maxillary sinuses are painful, right worse than left. She denies chest pain, shortness of breath, cough, GI symptoms. She has not taken anything for her symptoms.   Past Medical History:  Diagnosis Date  . IBS (irritable bowel syndrome)     Past Surgical History:  Procedure Laterality Date  . ANKLE SURGERY Right 10/08/2020  . removal of cervical CA      Family History  Problem Relation Age of Onset  . Hyperlipidemia Father   . Depression Mother   . Depression Paternal Aunt     Social History   Socioeconomic History  . Marital status: Married    Spouse name: Not on file  . Number of children: Not on file  . Years of education: Not on file  . Highest education level: Not on file  Occupational History  . Not on file  Tobacco Use  . Smoking status: Former Games developer  . Smokeless tobacco: Never Used  Substance and Sexual Activity  . Alcohol use: Yes  . Drug use: No  . Sexual activity: Yes  Other Topics Concern  . Not on file  Social History Narrative  . Not on file   Social Determinants of Health   Financial Resource Strain: Not on file  Food Insecurity: Not on  file  Transportation Needs: Not on file  Physical Activity: Not on file  Stress: Not on file  Social Connections: Not on file  Intimate Partner Violence: Not on file    Outpatient Medications Prior to Visit  Medication Sig Dispense Refill  . albuterol (VENTOLIN HFA) 108 (90 Base) MCG/ACT inhaler Inhale 2 puffs into the lungs every 6 (six) hours as needed. 6.7 g 0  . predniSONE (DELTASONE) 50 MG tablet One tab PO daily for 5 days. 5 tablet 0  . amoxicillin-clavulanate (AUGMENTIN) 875-125 MG tablet Take 1 tablet by mouth 2 (two) times daily. For 10 days. 20 tablet 0   No facility-administered medications prior to visit.    Allergies  Allergen Reactions  . Lexapro [Escitalopram Oxalate]     Not wanting to do anything and not caring about anything  . Paxil [Paroxetine Hcl] Nausea And Vomiting  . Zoloft [Sertraline Hcl] Other (See Comments)    Body shakes    Review of Systems All review of systems negative except what is listed in the HPI     Objective:    Physical Exam Vitals reviewed.  Constitutional:      Appearance: Normal appearance. She is normal weight.  HENT:     Head: Normocephalic and atraumatic.     Right Ear: Ear canal and external ear normal. A middle ear  effusion is present. There is no impacted cerumen.     Left Ear: Ear canal and external ear normal. A middle ear effusion is present. There is no impacted cerumen.     Nose:     Right Sinus: Maxillary sinus tenderness present.     Left Sinus: Maxillary sinus tenderness present.  Pulmonary:     Effort: Pulmonary effort is normal.  Skin:    General: Skin is warm and dry.  Neurological:     General: No focal deficit present.     Mental Status: She is alert and oriented to person, place, and time.     BP 125/76   Pulse 76   Wt 164 lb 14.4 oz (74.8 kg)   SpO2 100%   BMI 30.16 kg/m  Wt Readings from Last 3 Encounters:  01/19/21 164 lb 14.4 oz (74.8 kg)  10/24/20 183 lb (83 kg)  05/17/20 193 lb (87.5  kg)    Health Maintenance Due  Topic Date Due  . Hepatitis C Screening  Never done  . COVID-19 Vaccine (2 - Moderna 3-dose series) 05/02/2020  . INFLUENZA VACCINE  07/03/2020  . PAP SMEAR-Modifier  03/17/2021    There are no preventive care reminders to display for this patient.   Lab Results  Component Value Date   TSH 0.47 09/22/2018   Lab Results  Component Value Date   WBC 6.4 09/22/2018   HGB 13.0 09/22/2018   HCT 38.0 09/22/2018   MCV 88.0 09/22/2018   PLT 274 09/22/2018   Lab Results  Component Value Date   NA 139 09/22/2018   K 4.1 09/22/2018   CO2 25 09/22/2018   GLUCOSE 87 09/22/2018   BUN 12 09/22/2018   CREATININE 1.01 09/22/2018   BILITOT 0.4 09/22/2018   ALKPHOS 62 09/05/2016   AST 14 09/22/2018   ALT 8 09/22/2018   PROT 6.4 09/22/2018   ALBUMIN 4.5 09/05/2016   CALCIUM 9.2 09/22/2018   Lab Results  Component Value Date   CHOL 173 09/22/2018   Lab Results  Component Value Date   HDL 49 (L) 09/22/2018   Lab Results  Component Value Date   LDLCALC 108 (H) 09/22/2018   Lab Results  Component Value Date   TRIG 70 09/22/2018   Lab Results  Component Value Date   CHOLHDL 3.5 09/22/2018   Lab Results  Component Value Date   HGBA1C 5.0 02/18/2018       Assessment & Plan:   1. Pressure sensation in both ears  2. Acute recurrent maxillary sinusitis Patient with 4 weeks of sinusitis that is not improving at all. Will go ahead and treat as bacterial sinusitis. It has been right at 3 months since her last sinus infection. Encourage OTC allergy medicine in case she is allergic to something in her new home triggering sinus infections. Also recommend Flonase and Mucinex, humidifier use, hydration, and rest.   - amoxicillin-clavulanate (AUGMENTIN) 875-125 MG tablet; Take 1 tablet by mouth 2 (two) times daily for 7 days.  Dispense: 14 tablet; Refill: 0  3. Otitis media with effusion, bilateral No signs of ear infection today, but there does  appear to be bilateral effusions likely caused by the ongoing sinus issues. Recommend OTC flonase, mucinex, sudafed. Educated patient that effusions can take weeks to fully resolve, but hopefully she will start feeling better with OTC management and the antibiotics for the sinus infection will also cover AOM if one is starting to develop.    Meds ordered this  encounter  Medications  . amoxicillin-clavulanate (AUGMENTIN) 875-125 MG tablet    Sig: Take 1 tablet by mouth 2 (two) times daily for 7 days.    Dispense:  14 tablet    Refill:  0   Follow-up if symptoms worsen or fail to improve.   Clayborne Dana, NP

## 2021-01-19 NOTE — Patient Instructions (Signed)
Flonase, Mucinex twice a day Antibiotic for sinus infection  Feel better soon!!

## 2021-05-02 DIAGNOSIS — Z1231 Encounter for screening mammogram for malignant neoplasm of breast: Secondary | ICD-10-CM | POA: Diagnosis not present

## 2021-05-11 DIAGNOSIS — N6489 Other specified disorders of breast: Secondary | ICD-10-CM | POA: Diagnosis not present

## 2021-05-11 DIAGNOSIS — R922 Inconclusive mammogram: Secondary | ICD-10-CM | POA: Diagnosis not present

## 2021-05-11 DIAGNOSIS — R928 Other abnormal and inconclusive findings on diagnostic imaging of breast: Secondary | ICD-10-CM | POA: Diagnosis not present

## 2021-05-11 LAB — HM MAMMOGRAPHY

## 2021-05-15 ENCOUNTER — Encounter: Payer: Self-pay | Admitting: Neurology

## 2021-07-05 DIAGNOSIS — U071 COVID-19: Secondary | ICD-10-CM

## 2021-07-05 HISTORY — DX: COVID-19: U07.1

## 2021-07-08 ENCOUNTER — Encounter: Payer: Self-pay | Admitting: Emergency Medicine

## 2021-07-08 ENCOUNTER — Emergency Department
Admission: EM | Admit: 2021-07-08 | Discharge: 2021-07-08 | Disposition: A | Discharge status: Home / Self Care | Payer: BC Managed Care – PPO | Source: Home / Self Care | Attending: Family Medicine | Admitting: Family Medicine

## 2021-07-08 ENCOUNTER — Other Ambulatory Visit: Payer: Self-pay

## 2021-07-08 DIAGNOSIS — R059 Cough, unspecified: Secondary | ICD-10-CM | POA: Diagnosis not present

## 2021-07-08 DIAGNOSIS — Z20822 Contact with and (suspected) exposure to covid-19: Secondary | ICD-10-CM

## 2021-07-08 DIAGNOSIS — U071 COVID-19: Secondary | ICD-10-CM

## 2021-07-08 DIAGNOSIS — R058 Other specified cough: Secondary | ICD-10-CM

## 2021-07-08 MED ORDER — BENZONATATE 200 MG PO CAPS: 200.0000 mg | ORAL_CAPSULE | Freq: Two times a day (BID) | ORAL | 0 refills | Status: DC | PRN

## 2021-07-08 NOTE — Discharge Instructions (Addendum)
For your cough you will take Tessalon (prescription pill) every 12 hours.  Also take a dextromethorphan (DM) product that last 12 hours.  Good examples are Delsym or Mucinex DM Get lots of rest.  Drink fluids. Call for problems

## 2021-07-08 NOTE — ED Triage Notes (Signed)
COVID positive on Thursday  Cough started on Wed Family is all  positive  Called PCP - told to come be seen in Urgent Care- pt states her chest hurts from coughing  OTC - mucinex  Covid vaccine x 2

## 2021-07-09 NOTE — ED Provider Notes (Signed)
[facility]    CSN: [id number] Arrival date & time: 07/08/21  1226      History   Chief Complaint Chief Complaint  Patient presents with   Covid Positive   Cough    HPI Lauren Wade is a 40 y.o. female.   HPI  Patient states her whole family has COVID.  She has tested positive as well.  She is COVID vaccinated.  She is here because she has unremitting cough.  She has chest pain with coughing and with deep breathing in her anterior chest.  She called her primary care for video visit they declined stating that she needed to be seen in person if she was having chest pain.  She states that she is certain that she strained her ribs from all the coughing.  She has no history of heart disease and is certain this is not her heart.  She is here just to get medicine to help control the cough.  She has headache and body aches fatigue.  Some runny nose and sinus congestion.  No shortness of breath  Past Medical History:  Diagnosis Date   COVID 07/05/2021   IBS (irritable bowel syndrome)     Patient Active Problem List   Diagnosis Date Noted   Abnormal color of tongue 05/20/2020   Routine physical examination 03/21/2020   Overweight (BMI 25.0-29.9) 01/16/2019   Restless leg syndrome 09/22/2018   Binge eating disorder 08/25/2018   Right kidney stone 07/17/2018   Primary insomnia 03/17/2018   Chronic gastritis without bleeding 03/17/2018   Depressed mood 03/17/2018   Low TSH level 02/19/2018   Chronic idiopathic constipation 02/14/2018   Rectocele 02/11/2018   Epigastric pain 02/11/2018   Black stools 02/11/2018   GAD (generalized anxiety disorder) 01/07/2015   Cervical dysplasia 01/07/2015   Abnormal weight gain 01/07/2015   Class 2 obesity due to excess calories without serious comorbidity with body mass index (BMI) of 36.0 to 36.9 in adult 01/07/2015    Past Surgical History:  Procedure Laterality Date   ANKLE SURGERY Right 10/08/2020   removal of cervical  CA      OB History   No obstetric history on file.      Home Medications    Prior to Admission medications   Medication Sig Start Date End Date Taking? Authorizing Provider  benzonatate (TESSALON) 200 MG capsule Take 1 capsule (200 mg total) by mouth 2 (two) times daily as needed for cough. 07/08/21  Yes Eustace Moore, MD  albuterol (VENTOLIN HFA) 108 (90 Base) MCG/ACT inhaler Inhale 2 puffs into the lungs every 6 (six) hours as needed. 10/24/20   Jomarie Longs, PA-C    Family History Family History  Problem Relation Age of Onset   Depression Mother    Hyperlipidemia Father    Depression Paternal Aunt     Social History Social History   Tobacco Use   Smoking status: Former   Smokeless tobacco: Never  Substance Use Topics   Alcohol use: Not Currently   Drug use: No     Allergies   Lexapro [escitalopram oxalate], Paxil [paroxetine hcl], and Zoloft [sertraline hcl]   Review of Systems Review of Systems See HPI  Physical Exam Triage Vital Signs ED Triage Vitals  Enc Vitals Group     BP 07/08/21 1248 113/84     Pulse Rate 07/08/21 1248 99     Resp 07/08/21 1248 15     Temp 07/08/21 1248 98.7 F (37.1 C)  Temp Source 07/08/21 1248 Oral     SpO2 07/08/21 1248 100 %     Weight 07/08/21 1250 133 lb (60.3 kg)     Height 07/08/21 1250 5\' 2"  (1.575 m)     Head Circumference --      Peak Flow --      Pain Score 07/08/21 1250 0     Pain Loc --      Pain Edu? --      Excl. in GC? --    No data found.  Updated Vital Signs BP 113/84 (BP Location: Right Arm)   Pulse 99   Temp 98.7 F (37.1 C) (Oral)   Resp 15   Ht 5\' 2"  (1.575 m)   Wt 60.3 kg   LMP 07/06/2021 (Exact Date)   SpO2 100%   BMI 24.33 kg/m     Physical Exam Constitutional:      General: She is not in acute distress.    Appearance: She is well-developed.  HENT:     Head: Normocephalic and atraumatic.     Mouth/Throat:     Comments: Mask is in place Eyes:      Conjunctiva/sclera: Conjunctivae normal.     Pupils: Pupils are equal, round, and reactive to light.  Cardiovascular:     Rate and Rhythm: Normal rate and regular rhythm.     Heart sounds: Normal heart sounds.  Pulmonary:     Effort: Pulmonary effort is normal. No respiratory distress.     Comments: Lungs are clear Abdominal:     General: There is no distension.     Palpations: Abdomen is soft.  Musculoskeletal:        General: Normal range of motion.     Cervical back: Normal range of motion.  Skin:    General: Skin is warm and dry.  Neurological:     Mental Status: She is alert.     UC Treatments / Results  Labs (all labs ordered are listed, but only abnormal results are displayed) Labs Reviewed - No data to display  EKG   Radiology No results found.  Procedures Procedures (including critical care time)  Medications Ordered in UC Medications - No data to display  Initial Impression / Assessment and Plan / UC Course  I have reviewed the triage vital signs and the nursing notes.  Pertinent labs & imaging results that were available during my care of the patient were reviewed by me and considered in my medical decision making (see chart for details).     Discussed symptomatic care of viral cough including COVID.  Return as needed Final Clinical Impressions(s) / UC Diagnoses   Final diagnoses:  Cough  Cough with exposure to COVID-19 virus  COVID-19     Discharge Instructions      For your cough you will take Tessalon (prescription pill) every 12 hours.  Also take a dextromethorphan (DM) product that last 12 hours.  Good examples are Delsym or Mucinex DM Get lots of rest.  Drink fluids. Call for problems     ED Prescriptions     Medication Sig Dispense Auth. Provider   benzonatate (TESSALON) 200 MG capsule Take 1 capsule (200 mg total) by mouth 2 (two) times daily as needed for cough. 20 capsule , MD      PDMP not reviewed this  encounter.   09/05/2021, MD 07/09/21 [phone]

## 2021-08-31 ENCOUNTER — Other Ambulatory Visit (HOSPITAL_COMMUNITY)
Admission: RE | Admit: 2021-08-31 | Discharge: 2021-08-31 | Disposition: A | Discharge status: Home / Self Care | Payer: BC Managed Care – PPO | Source: Ambulatory Visit | Attending: Family Medicine | Admitting: Family Medicine

## 2021-08-31 ENCOUNTER — Other Ambulatory Visit: Payer: Self-pay

## 2021-08-31 ENCOUNTER — Emergency Department
Admission: EM | Admit: 2021-08-31 | Discharge: 2021-08-31 | Disposition: A | Discharge status: Home / Self Care | Payer: BC Managed Care – PPO | Source: Home / Self Care

## 2021-08-31 ENCOUNTER — Encounter: Payer: Self-pay | Admitting: Family Medicine

## 2021-08-31 DIAGNOSIS — X58XXXD Exposure to other specified factors, subsequent encounter: Secondary | ICD-10-CM | POA: Diagnosis not present

## 2021-08-31 DIAGNOSIS — M549 Dorsalgia, unspecified: Secondary | ICD-10-CM | POA: Insufficient documentation

## 2021-08-31 DIAGNOSIS — T192XXD Foreign body in vulva and vagina, subsequent encounter: Secondary | ICD-10-CM | POA: Insufficient documentation

## 2021-08-31 DIAGNOSIS — R109 Unspecified abdominal pain: Secondary | ICD-10-CM

## 2021-08-31 NOTE — ED Provider Notes (Signed)
[facility]    CSN: [id number] Arrival date & time: 08/31/21  1650      History   Chief Complaint Chief Complaint  Patient presents with   Abdominal Pain   Back Pain     HPI Lauren Wade is a 40 y.o. female.   HPI 40 year old female presents with abdominal pain and cramping for 4 days.  Patient reports accidentally leaving tampon in for 4 days.  Reports no recent change in bowel habits and moving bowels daily.  PMH significant for epigastric pain, chronic idiopathic constipation, chronic gastritis without bleeding, IBS, black stools, and binge eating disorder.  Past Medical History:  Diagnosis Date   COVID 07/05/2021   IBS (irritable bowel syndrome)     Patient Active Problem List   Diagnosis Date Noted   Abnormal color of tongue 05/20/2020   Routine physical examination 03/21/2020   Restless leg syndrome 09/22/2018   Binge eating disorder 08/25/2018   Right kidney stone 07/17/2018   Primary insomnia 03/17/2018   Chronic gastritis without bleeding 03/17/2018   Depressed mood 03/17/2018   Low TSH level 02/19/2018   Chronic idiopathic constipation 02/14/2018   Rectocele 02/11/2018   Epigastric pain 02/11/2018   Black stools 02/11/2018   GAD (generalized anxiety disorder) 01/07/2015    Past Surgical History:  Procedure Laterality Date   ANKLE SURGERY Right 10/08/2020   removal of cervical CA      OB History   No obstetric history on file.      Home Medications    Prior to Admission medications   Medication Sig Start Date End Date Taking? Authorizing Provider  albuterol (VENTOLIN HFA) 108 (90 Base) MCG/ACT inhaler Inhale 2 puffs into the lungs every 6 (six) hours as needed. Patient not taking: Reported on 08/31/2021 10/24/20   Jomarie Longs, PA-C  benzonatate (TESSALON) 200 MG capsule Take 1 capsule (200 mg total) by mouth 2 (two) times daily as needed for cough. Patient not taking: Reported on 08/31/2021 07/08/21   Eustace Moore, MD     Family History Family History  Problem Relation Age of Onset   Depression Mother    Hyperlipidemia Father    Depression Paternal Aunt     Social History Social History   Tobacco Use   Smoking status: Former   Smokeless tobacco: Never  Substance Use Topics   Alcohol use: Not Currently   Drug use: No     Allergies   Lexapro [escitalopram oxalate], Paxil [paroxetine hcl], and Zoloft [sertraline hcl]   Review of Systems Review of Systems  All other systems reviewed and are negative.   Physical Exam Triage Vital Signs ED Triage Vitals  Enc Vitals Group     BP      Pulse      Resp      Temp      Temp src      SpO2      Weight      Height      Head Circumference      Peak Flow      Pain Score      Pain Loc      Pain Edu?      Excl. in GC?    No data found.  Updated Vital Signs BP 127/84 (BP Location: Left Arm)   Pulse 76   Temp 98.3 F (36.8 C) (Oral)   Resp 12   SpO2 100%    Physical Exam Vitals and nursing note reviewed.  Constitutional:      General: She is not in acute distress.    Appearance: Normal appearance. She is normal weight. She is not ill-appearing.  HENT:     Head: Normocephalic and atraumatic.     Mouth/Throat:     Mouth: Mucous membranes are moist.     Pharynx: Oropharynx is clear.  Eyes:     Extraocular Movements: Extraocular movements intact.     Conjunctiva/sclera: Conjunctivae normal.     Pupils: Pupils are equal, round, and reactive to light.  Cardiovascular:     Rate and Rhythm: Normal rate and regular rhythm.     Pulses: Normal pulses.     Heart sounds: Normal heart sounds.  Pulmonary:     Effort: Pulmonary effort is normal.     Breath sounds: Normal breath sounds. No wheezing, rhonchi or rales.  Abdominal:     General: Bowel sounds are normal. There is no distension.     Palpations: Abdomen is soft. There is no mass.     Tenderness: There is no abdominal tenderness. There is no right CVA tenderness, left CVA  tenderness, guarding or rebound.     Hernia: No hernia is present.  Musculoskeletal:        General: Normal range of motion.     Cervical back: Normal range of motion and neck supple.  Skin:    General: Skin is warm and dry.  Neurological:     General: No focal deficit present.     Mental Status: She is alert and oriented to person, place, and time. Mental status is at baseline.  Psychiatric:        Mood and Affect: Mood normal.        Behavior: Behavior normal.        Thought Content: Thought content normal.     UC Treatments / Results  Labs (all labs ordered are listed, but only abnormal results are displayed) Labs Reviewed  CERVICOVAGINAL ANCILLARY ONLY    EKG   Radiology No results found.  Procedures Procedures (including critical care time)  Medications Ordered in UC Medications - No data to display  Initial Impression / Assessment and Plan / UC Course  I have reviewed the triage vital signs and the nursing notes.  Pertinent labs & imaging results that were available during my care of the patient were reviewed by me and considered in my medical decision making (see chart for details).     MDM: 1.  Abdominal cramping-Aptima swab ordered. Advised/encouraged patient that we will follow-up with her Aptima swab results once returned.  Patient discharged home, hemodynamically stable. Final Clinical Impressions(s) / UC Diagnoses   Final diagnoses:  Abdominal cramping     Discharge Instructions      Advised/encouraged patient that we will follow-up with her Aptima swab results once returned.     ED Prescriptions   None    PDMP not reviewed this encounter.   Trevor Iha, FNP 08/31/21 1725

## 2021-08-31 NOTE — ED Triage Notes (Signed)
Pt presents with abdominal pain and back pain after discovering she left her tampon in for four days.

## 2021-08-31 NOTE — Discharge Instructions (Addendum)
Advised/encouraged patient that we will follow-up with her Aptima swab results once returned.

## 2021-09-04 LAB — CERVICOVAGINAL ANCILLARY ONLY
Bacterial Vaginitis (gardnerella): NEGATIVE
Candida Glabrata: NEGATIVE
Candida Vaginitis: NEGATIVE
Comment: NEGATIVE
Comment: NEGATIVE
Comment: NEGATIVE

## 2021-10-09 ENCOUNTER — Other Ambulatory Visit: Payer: Self-pay

## 2021-10-09 ENCOUNTER — Ambulatory Visit (INDEPENDENT_AMBULATORY_CARE_PROVIDER_SITE_OTHER): Payer: BC Managed Care – PPO | Admitting: Physician Assistant

## 2021-10-09 ENCOUNTER — Encounter: Payer: Self-pay | Admitting: Physician Assistant

## 2021-10-09 VITALS — BP 109/81 | HR 100 | Temp 100.4°F | Ht 62.0 in | Wt 134.0 lb

## 2021-10-09 DIAGNOSIS — R509 Fever, unspecified: Secondary | ICD-10-CM

## 2021-10-09 DIAGNOSIS — J101 Influenza due to other identified influenza virus with other respiratory manifestations: Secondary | ICD-10-CM

## 2021-10-09 DIAGNOSIS — R0989 Other specified symptoms and signs involving the circulatory and respiratory systems: Secondary | ICD-10-CM

## 2021-10-09 LAB — POCT INFLUENZA A/B
Influenza A, POC: POSITIVE — AB
Influenza B, POC: NEGATIVE

## 2021-10-09 MED ORDER — PREDNISONE 50 MG PO TABS: ORAL_TABLET | ORAL | 0 refills | Status: DC

## 2021-10-09 MED ORDER — HYDROCOD POLST-CPM POLST ER 10-8 MG/5ML PO SUER: 5.0000 mL | Freq: Two times a day (BID) | ORAL | 0 refills | Status: DC | PRN

## 2021-10-09 MED ORDER — ALBUTEROL SULFATE HFA 108 (90 BASE) MCG/ACT IN AERS: 2.0000 | INHALATION_SPRAY | RESPIRATORY_TRACT | 0 refills | Status: DC | PRN

## 2021-10-09 NOTE — Progress Notes (Signed)
Subjective:    Patient ID: Lauren Wade, female    DOB: 1981/07/22, 40 y.o.   MRN: [id number]  HPI Pt is a 40 yo female who has had fever and chest congestion and cough since Thursday of last week. She having body aches and chills. Many sick contacts as school but none at home. Taking OTC theraflu with little relief. Having problems sleeping at night. Negative for covid via home test. Her cough is productive. No SOb.   Marland Kitchen. Active Ambulatory Problems    Diagnosis Date Noted   GAD (generalized anxiety disorder) 01/07/2015   Rectocele 02/11/2018   Epigastric pain 02/11/2018   Black stools 02/11/2018   Chronic idiopathic constipation 02/14/2018   Low TSH level 02/19/2018   Primary insomnia 03/17/2018   Chronic gastritis without bleeding 03/17/2018   Depressed mood 03/17/2018   Right kidney stone 07/17/2018   Binge eating disorder 08/25/2018   Restless leg syndrome 09/22/2018   Routine physical examination 03/21/2020   Abnormal color of tongue 05/20/2020   Resolved Ambulatory Problems    Diagnosis Date Noted   BRONCHITIS 01/21/2010   Cervical dysplasia 01/07/2015   Abnormal weight gain 01/07/2015   Class 2 obesity due to excess calories without serious comorbidity with body mass index (BMI) of 36.0 to 36.9 in adult 01/07/2015   Right shoulder pain 01/11/2015   RUQ pain 05/05/2015   Irritability and anger 07/17/2018   Mood changes 07/17/2018   Easy bruising 09/22/2018   Acute bronchitis 11/10/2018   Overweight (BMI 25.0-29.9) 01/16/2019   Ear pressure, right 05/20/2020   Past Medical History:  Diagnosis Date   COVID 07/05/2021   IBS (irritable bowel syndrome)        Review of Systems See HPI.     Objective:   Physical Exam Vitals reviewed.  Constitutional:      Appearance: Normal appearance.  HENT:     Head: Normocephalic.     Nose: Congestion present.     Mouth/Throat:     Pharynx: Posterior oropharyngeal erythema present.  Eyes:     General:         Right eye: No discharge.        Left eye: No discharge.  Cardiovascular:     Rate and Rhythm: Normal rate and regular rhythm.     Pulses: Normal pulses.  Pulmonary:     Effort: Pulmonary effort is normal.     Breath sounds: Rhonchi present.  Musculoskeletal:     Cervical back: Normal range of motion and neck supple.  Lymphadenopathy:     Cervical: No cervical adenopathy.  Neurological:     Mental Status: She is alert.  Psychiatric:        Mood and Affect: Mood normal.   .. Results for orders placed or performed in visit on 10/09/21  POCT Influenza A/B  Result Value Ref Range   Influenza A, POC Positive (A) Negative   Influenza B, POC Negative Negative          Assessment & Plan:  Marland KitchenMarland KitchenAmber was seen today for nasal congestion.  Diagnoses and all orders for this visit:  Influenza A -     predniSONE (DELTASONE) 50 MG tablet; One tab PO daily for 5 days. -     albuterol (VENTOLIN HFA) 108 (90 Base) MCG/ACT inhaler; Inhale 2 puffs into the lungs every 4 (four) hours as needed.  Fever, unspecified fever cause -     POCT Influenza A/B -     predniSONE (DELTASONE) 50  MG tablet; One tab PO daily for 5 days. -     albuterol (VENTOLIN HFA) 108 (90 Base) MCG/ACT inhaler; Inhale 2 puffs into the lungs every 4 (four) hours as needed. -     chlorpheniramine-HYDROcodone (TUSSIONEX) 10-8 MG/5ML SUER; Take 5 mLs by mouth every 12 (twelve) hours as needed.  Chest congestion -     POCT Influenza A/B -     predniSONE (DELTASONE) 50 MG tablet; One tab PO daily for 5 days. -     albuterol (VENTOLIN HFA) 108 (90 Base) MCG/ACT inhaler; Inhale 2 puffs into the lungs every 4 (four) hours as needed. -     chlorpheniramine-HYDROcodone (TUSSIONEX) 10-8 MG/5ML SUER; Take 5 mLs by mouth every 12 (twelve) hours as needed.  Out of window for tamiflu.  Prednisone and albuterol sent for cough.  Tussionex to help sleep at night.  Written out for Friday/today/tomorrow and Wednesday.  Symptomatic care  discussed.  Follow up as needed or if symptoms worsen.

## 2021-10-09 NOTE — Patient Instructions (Signed)
Influenza, Adult °Influenza, also called "the flu," is a viral infection that mainly affects the respiratory tract. This includes the lungs, nose, and throat. The flu spreads easily from person to person (is contagious). It causes common cold symptoms, along with high fever and body aches. °What are the causes? °This condition is caused by the influenza virus. You can get the virus by: °Breathing in droplets that are in the air from an infected person's cough or sneeze. °Touching something that has the virus on it (has been contaminated) and then touching your mouth, nose, or eyes. °What increases the risk? °The following factors may make you more likely to get the flu: °Not washing or sanitizing your hands often. °Having close contact with many people during cold and flu season. °Touching your mouth, eyes, or nose without first washing or sanitizing your hands. °Not getting an annual flu shot. °You may have a higher risk for the flu, including serious problems, such as a lung infection (pneumonia), if you: °Are older than 65. °Are pregnant. °Have a weakened disease-fighting system (immune system). This includes people who have HIV or AIDS, are on chemotherapy, or are taking medicines that reduce (suppress) the immune system. °Have a long-term (chronic) illness, such as heart disease, kidney disease, diabetes, or lung disease. °Have a liver disorder. °Are severely overweight (morbidly obese). °Have anemia. °Have asthma. °What are the signs or symptoms? °Symptoms of this condition usually begin suddenly and last 4-14 days. These may include: °Fever and chills. °Headaches, body aches, or muscle aches. °Sore throat. °Cough. °Runny or stuffy (congested) nose. °Chest discomfort. °Poor appetite. °Weakness or fatigue. °Dizziness. °Nausea or vomiting. °How is this diagnosed? °This condition may be diagnosed based on: °Your symptoms and medical history. °A physical exam. °Swabbing your nose or throat and testing the fluid  for the influenza virus. °How is this treated? °If the flu is diagnosed early, you can be treated with antiviral medicine that is given by mouth (orally) or through an IV. This can help reduce how severe the illness is and how long it lasts. °Taking care of yourself at home can help relieve symptoms. Your health care provider may recommend: °Taking over-the-counter medicines. °Drinking plenty of fluids. °In many cases, the flu goes away on its own. If you have severe symptoms or complications, you may be treated in a hospital. °Follow these instructions at home: °Activity °Rest as needed and get plenty of sleep. °Stay home from work or school as told by your health care provider. Unless you are visiting your health care provider, avoid leaving home until your fever has been gone for 24 hours without taking medicine. °Eating and drinking °Take an oral rehydration solution (ORS). This is a drink that is sold at pharmacies and retail stores. °Drink enough fluid to keep your urine pale yellow. °Drink clear fluids in small amounts as you are able. Clear fluids include water, ice chips, fruit juice mixed with water, and low-calorie sports drinks. °Eat bland, easy-to-digest foods in small amounts as you are able. These foods include bananas, applesauce, rice, lean meats, toast, and crackers. °Avoid drinking fluids that contain a lot of sugar or caffeine, such as energy drinks, regular sports drinks, and soda. °Avoid alcohol. °Avoid spicy or fatty foods. °General instructions °  °Take over-the-counter and prescription medicines only as told by your health care provider. °Use a cool mist humidifier to add humidity to the air in your home. This can make it easier to breathe. °When using a cool mist humidifier,   clean it daily. Empty the water and replace it with clean water. °Cover your mouth and nose when you cough or sneeze. °Wash your hands with soap and water often and for at least 20 seconds, especially after you cough or  sneeze. If soap and water are not available, use alcohol-based hand sanitizer. °Keep all follow-up visits. This is important. °How is this prevented? ° °Get an annual flu shot. This is usually available in late summer, fall, or winter. Ask your health care provider when you should get your flu shot. °Avoid contact with people who are sick during cold and flu season. This is generally fall and winter. °Contact a health care provider if: °You develop new symptoms. °You have: °Chest pain. °Diarrhea. °A fever. °Your cough gets worse. °You produce more mucus. °You feel nauseous or you vomit. °Get help right away if you: °Develop shortness of breath or have difficulty breathing. °Have skin or nails that turn a bluish color. °Have severe pain or stiffness in your neck. °Develop a sudden headache or sudden pain in your face or ear. °Cannot eat or drink without vomiting. °These symptoms may represent a serious problem that is an emergency. Do not wait to see if the symptoms will go away. Get medical help right away. Call your local emergency services (911 in the [place] Do not drive yourself to the hospital. °Summary °Influenza, also called "the flu," is a viral infection that primarily affects your respiratory tract. °Symptoms of the flu usually begin suddenly and last 4-14 days. °Getting an annual flu shot is the best way to prevent getting the flu. °Stay home from work or school as told by your health care provider. Unless you are visiting your health care provider, avoid leaving home until your fever has been gone for 24 hours without taking medicine. °Keep all follow-up visits. This is important. °This information is not intended to replace advice given to you by your health care provider. Make sure you discuss any questions you have with your health care provider. °Document Revised: 07/08/2020 Document Reviewed: 07/08/2020 °Elsevier Patient Education © 2022 [employer] ° °

## 2021-10-20 ENCOUNTER — Encounter: Payer: Self-pay | Admitting: Physician Assistant

## 2021-10-20 MED ORDER — AZITHROMYCIN 250 MG PO TABS: ORAL_TABLET | ORAL | 0 refills | Status: DC

## 2021-12-27 ENCOUNTER — Encounter: Payer: Self-pay | Admitting: Physician Assistant

## 2021-12-27 ENCOUNTER — Ambulatory Visit: Payer: BC Managed Care – PPO | Admitting: Physician Assistant

## 2021-12-27 ENCOUNTER — Ambulatory Visit (INDEPENDENT_AMBULATORY_CARE_PROVIDER_SITE_OTHER): Payer: BC Managed Care – PPO

## 2021-12-27 ENCOUNTER — Other Ambulatory Visit: Payer: Self-pay

## 2021-12-27 ENCOUNTER — Other Ambulatory Visit: Payer: Self-pay | Admitting: Physician Assistant

## 2021-12-27 VITALS — BP 146/88 | HR 83 | Ht 61.0 in | Wt 136.0 lb

## 2021-12-27 DIAGNOSIS — R0789 Other chest pain: Secondary | ICD-10-CM | POA: Diagnosis not present

## 2021-12-27 MED ORDER — CYCLOBENZAPRINE HCL 5 MG PO TABS: 5.0000 mg | ORAL_TABLET | Freq: Three times a day (TID) | ORAL | 0 refills | Status: DC | PRN

## 2021-12-27 MED ORDER — KETOROLAC TROMETHAMINE 60 MG/2ML IM SOLN
30.0000 mg | Freq: Once | INTRAMUSCULAR | Status: AC
  Administered 2021-12-27: 11:00:00 30 mg via INTRAMUSCULAR

## 2021-12-27 MED ORDER — KETOROLAC TROMETHAMINE 30 MG/ML IJ SOLN: 30.0000 mg | Freq: Once | INTRAMUSCULAR | 0 refills | Status: DC

## 2021-12-27 MED ORDER — METHYLPREDNISOLONE 4 MG PO TBPK: ORAL_TABLET | ORAL | 0 refills | Status: DC

## 2021-12-27 NOTE — Progress Notes (Signed)
Subjective:     Patient ID: Lauren Wade, female   DOB: March 22, 1981, 41 y.o.   MRN: [id number]  HPI 41 YO female presents to the clinic complaining of chest pain. She has had intermittent episodes of pain since the summer. This episode started yesterday and is more severe than the past. She has constant sternal radiating to left chest pain that feels like a  sharp, pulling sensation. She did not try anything. Sitting up from laying down and certain movements exacerbates the pain.  Positive for numbness and tingling down her arm. Sternum is tender to palpation. She denies any GERD/reflux symptoms, shortness of breath, palpitations, dizziness, or diaphoresis. She denies any new workouts or injuries. CP does not correlate to anxiety. She did have back to back virus with coughing in December into January.   Review of Systems  Constitutional:  Negative for activity change, appetite change and diaphoresis.  Respiratory:  Positive for chest tightness. Negative for shortness of breath.   Cardiovascular:  Positive for chest pain.  Gastrointestinal:  Negative for abdominal distention, abdominal pain, constipation, diarrhea, nausea and vomiting.  Musculoskeletal:  Positive for back pain.  Neurological:  Negative for dizziness and light-headedness.      Objective:   Physical Exam Constitutional:      General: She is not in acute distress.    Appearance: She is well-developed and normal weight. She is not ill-appearing, toxic-appearing or diaphoretic.  HENT:     Head: Normocephalic and atraumatic.  Cardiovascular:     Rate and Rhythm: Normal rate and regular rhythm.     Heart sounds: Normal heart sounds.  Pulmonary:     Effort: Pulmonary effort is normal. No tachypnea or respiratory distress.     Breath sounds: Normal breath sounds.  Chest:     Chest wall: Tenderness present.     Comments: Sternum and left pectoral tender to palpation Musculoskeletal:     Left shoulder: No swelling. Normal  range of motion.     Cervical back: Normal range of motion and neck supple.  Skin:    General: Skin is warm and dry.  Neurological:     Mental Status: She is alert and oriented to person, place, and time.  Psychiatric:        Mood and Affect: Mood normal.        Behavior: Behavior normal.      ..EKG: normal EKG, normal sinus rhythm, unchanged from previous tracings.  Assessment:     Marland KitchenMarland KitchenAmber was seen today for chest pain.  Diagnoses and all orders for this visit:  Sternum pain -     DG Chest 2 View; Future -     cyclobenzaprine (FLEXERIL) 5 MG tablet; Take 1 tablet (5 mg total) by mouth 3 (three) times daily as needed for muscle spasms. -     ketorolac (TORADOL) injection 30 mg  Left-sided chest wall pain -     DG Chest 2 View; Future -     cyclobenzaprine (FLEXERIL) 5 MG tablet; Take 1 tablet (5 mg total) by mouth 3 (three) times daily as needed for muscle spasms. -     ketorolac (TORADOL) injection 30 mg  Other orders -     Discontinue: ketorolac (TORADOL) 30 MG/ML injection; Inject 1 mL (30 mg total) into the vein once for 1 dose.       Plan:     Give toradol injection for likely inflammatory etiology of pain. Get CXR to look for any sternum abnormalities or masses.  Start flexeril to relax muscles and improve pain symptoms.   Unlikely to be cardiac related. Completely normal EKG and patient denies shortness of breath, diaphoresis and no family history. Last LDL 108, HDL 49. Will consider stress test is symptoms persist with toradol and flexeril.   Unlikely to be reflux related. Patient denies any GERD symptoms. Her symptoms are not correlated with meals. She denies any abdominal pain, belching or reflux.   Advised patient to follow up if her symptoms worsen or do not improve.   CXR with no abnormalities. Sent medrol dose pack for costochondritis.

## 2021-12-27 NOTE — Progress Notes (Signed)
No acute finding. Lungs and heart look great.  Medrol dose pack sent to pharmacy to start tomorrow morning.

## 2021-12-29 NOTE — Addendum Note (Signed)
Addended by: Chalmers Cater on: 12/29/2021 10:28 AM   Modules accepted: Orders

## 2022-03-02 ENCOUNTER — Telehealth: Payer: BC Managed Care – PPO | Admitting: Physician Assistant

## 2022-03-02 DIAGNOSIS — J019 Acute sinusitis, unspecified: Secondary | ICD-10-CM | POA: Diagnosis not present

## 2022-03-02 DIAGNOSIS — B9689 Other specified bacterial agents as the cause of diseases classified elsewhere: Secondary | ICD-10-CM | POA: Diagnosis not present

## 2022-03-02 MED ORDER — AMOXICILLIN-POT CLAVULANATE 875-125 MG PO TABS: 1.0000 | ORAL_TABLET | Freq: Two times a day (BID) | ORAL | 0 refills | Status: DC

## 2022-03-02 NOTE — Progress Notes (Signed)

## 2022-03-16 ENCOUNTER — Encounter: Payer: Self-pay | Admitting: Sports Medicine

## 2022-03-16 ENCOUNTER — Ambulatory Visit: Payer: BC Managed Care – PPO | Admitting: Sports Medicine

## 2022-03-16 DIAGNOSIS — J0101 Acute recurrent maxillary sinusitis: Secondary | ICD-10-CM | POA: Diagnosis not present

## 2022-03-16 MED ORDER — DOXYCYCLINE HYCLATE 100 MG PO TABS: 100.0000 mg | ORAL_TABLET | Freq: Two times a day (BID) | ORAL | 0 refills | Status: AC

## 2022-03-16 MED ORDER — PREDNISONE 50 MG PO TABS: ORAL_TABLET | ORAL | 0 refills | Status: DC

## 2022-03-16 NOTE — Progress Notes (Signed)
? ? ?  Procedures performed today:   ? ?None. ? ?Independent interpretation of notes and tests performed by another provider:  ? ?None. ? ?Brief History, Exam, Impression, and Recommendations:   ? ?Recurrent maxillary sinusitis ?Pleasant 41 year old female, she had symptoms of maxillary sinusitis about a week and 1/2 to 2 weeks ago, got a little better with amoxicillin initially and then worsened. ?She has headaches, facial pain and pressure, sore throat. ?Ears feel stopped up. ?On exam her ears are normal, she has some minimal tenderness in her neck exam, oropharyngeal structures are unremarkable to inspection. ?Speaking full sentences. ?I think she is experiencing the double sickening of a true sinus infection, adding doxycycline, prednisone, she will do a COVID test at home, return to see me if not better in a week or 2. ? ? ? ?___________________________________________ ?Gwen Her. Dianah Field, M.D., ABFM., CAQSM. ?Primary Care and Sports Medicine ?[place] ? ?Adjunct Instructor of Family Medicine  ?[employer] ?

## 2022-03-16 NOTE — Assessment & Plan Note (Signed)
Pleasant 41 year old female, she had symptoms of maxillary sinusitis about a week and 1/2 to 2 weeks ago, got a little better with amoxicillin initially and then worsened. ?She has headaches, facial pain and pressure, sore throat. ?Ears feel stopped up. ?On exam her ears are normal, she has some minimal tenderness in her neck exam, oropharyngeal structures are unremarkable to inspection. ?Speaking full sentences. ?I think she is experiencing the double sickening of a true sinus infection, adding doxycycline, prednisone, she will do a COVID test at home, return to see me if not better in a week or 2. ?

## 2022-04-03 ENCOUNTER — Encounter: Payer: Self-pay | Admitting: Physician Assistant

## 2022-04-09 ENCOUNTER — Encounter: Payer: Self-pay | Admitting: Physician Assistant

## 2022-04-09 ENCOUNTER — Ambulatory Visit: Payer: BC Managed Care – PPO | Admitting: Physician Assistant

## 2022-04-09 VITALS — BP 110/93 | HR 89 | Ht 61.0 in | Wt 146.0 lb

## 2022-04-09 DIAGNOSIS — F5081 Binge eating disorder: Secondary | ICD-10-CM

## 2022-04-09 DIAGNOSIS — Z13220 Encounter for screening for lipoid disorders: Secondary | ICD-10-CM

## 2022-04-09 DIAGNOSIS — R7989 Other specified abnormal findings of blood chemistry: Secondary | ICD-10-CM

## 2022-04-09 DIAGNOSIS — F4323 Adjustment disorder with mixed anxiety and depressed mood: Secondary | ICD-10-CM | POA: Diagnosis not present

## 2022-04-09 DIAGNOSIS — F410 Panic disorder [episodic paroxysmal anxiety] without agoraphobia: Secondary | ICD-10-CM | POA: Diagnosis not present

## 2022-04-09 DIAGNOSIS — Z79899 Other long term (current) drug therapy: Secondary | ICD-10-CM

## 2022-04-09 DIAGNOSIS — Z131 Encounter for screening for diabetes mellitus: Secondary | ICD-10-CM

## 2022-04-09 MED ORDER — LISDEXAMFETAMINE DIMESYLATE 20 MG PO CAPS: 20.0000 mg | ORAL_CAPSULE | Freq: Every day | ORAL | 0 refills | Status: DC

## 2022-04-09 MED ORDER — LISDEXAMFETAMINE DIMESYLATE 20 MG PO CAPS: 20.0000 mg | ORAL_CAPSULE | ORAL | 0 refills | Status: DC

## 2022-04-09 NOTE — Progress Notes (Signed)
? ?Established Patient Office Visit ? ?Subjective   ?Patient ID: Lauren Wade, female    DOB: [birth year]  Age: 41 y.o. MRN: [id number] ? ?Chief Complaint  ?Patient presents with  ? Anxiety  ? ? ?HPI ?Pt is a 41 yo female who presents to the clinic with worsening anxiety and having panic attacks again. She is in the middle of seperation with husband who is struggling with alcohol addiction. She is having to be single mother and work full time. She has started to have panic attacks again and binge eat. She has worked so hard to get the weight off she does not want to gain it back. She is not exercising. She does want to start counseling. She does want to work it out with husband. No SI/HC. She did have a weekend break to reset and feels better about everything. She did not have any panic attacks this weekend.  ? ? ?Patient Active Problem List  ? Diagnosis Date Noted  ? Acute adjustment disorder with mixed anxiety and depressed mood 04/09/2022  ? Panic attacks 04/09/2022  ? Recurrent maxillary sinusitis 03/16/2022  ? Abnormal color of tongue 05/20/2020  ? Routine physical examination 03/21/2020  ? Restless leg syndrome 09/22/2018  ? Binge eating disorder 08/25/2018  ? Right kidney stone 07/17/2018  ? Primary insomnia 03/17/2018  ? Chronic gastritis without bleeding 03/17/2018  ? Depressed mood 03/17/2018  ? Low TSH level 02/19/2018  ? Chronic idiopathic constipation 02/14/2018  ? Rectocele 02/11/2018  ? Epigastric pain 02/11/2018  ? Black stools 02/11/2018  ? GAD (generalized anxiety disorder) 01/07/2015  ? ?Past Medical History:  ?Diagnosis Date  ? COVID 07/05/2021  ? IBS (irritable bowel syndrome)   ? ?Allergies  ?Allergen Reactions  ? Lexapro [Escitalopram Oxalate]   ?  Not wanting to do anything and not caring about anything  ? Paxil [Paroxetine Hcl] Nausea And Vomiting  ? Zoloft [Sertraline Hcl] Other (See Comments)  ?  Body shakes  ? ?  ? ?ROS ?See HPI. ?  ?Objective:  ?  ? ?BP (!) 110/93   Pulse 89   Ht 5'  1" (1.549 m)   Wt 146 lb (66.2 kg)   SpO2 99%   BMI 27.59 kg/m?  ?BP Readings from Last 3 Encounters:  ?04/09/22 (!) 110/93  ?03/16/22 125/83  ?12/27/21 (!) 146/88  ? ?Wt Readings from Last 3 Encounters:  ?04/09/22 146 lb (66.2 kg)  ?03/16/22 140 lb 1.3 oz (63.5 kg)  ?12/27/21 136 lb (61.7 kg)  ? ?  ? ?Physical Exam ?Vitals reviewed.  ?HENT:  ?   Head: Normocephalic.  ?Cardiovascular:  ?   Rate and Rhythm: Normal rate.  ?Pulmonary:  ?   Effort: Pulmonary effort is normal.  ?Neurological:  ?   General: No focal deficit present.  ?   Mental Status: She is alert and oriented to person, place, and time.  ?Psychiatric:     ?   Mood and Affect: Mood normal.     ?   Behavior: Behavior normal.  ?.. ? ?  04/09/2022  ?  8:27 AM 03/21/2020  ? 10:45 AM 01/16/2019  ? 10:22 AM 07/15/2018  ? 10:44 AM 05/20/2018  ?  4:08 PM  ?Depression screen PHQ 2/9  ?Decreased Interest 3 2 1 2 1   ?Down, Depressed, Hopeless 2 1 1 2 2   ?PHQ - 2 Score 5 3 2 4 3   ?Altered sleeping 2 1 3 1 2   ?Tired, decreased energy 2 3 2  2  3  ?Change in appetite 3 3 1 3 3   ?Feeling bad or failure about yourself  2 3 1 2 2   ?Trouble concentrating 3 1 1 3 3   ?Moving slowly or fidgety/restless 3 0 0 0 0  ?Suicidal thoughts 0 0 0 0 0  ?PHQ-9 Score 20 14 10 15 16   ?Difficult doing work/chores Very difficult Somewhat difficult Somewhat difficult Very difficult Somewhat difficult  ? ?.. ? ?  04/09/2022  ?  8:29 AM 03/21/2020  ? 10:45 AM 01/16/2019  ? 10:22 AM 07/15/2018  ? 10:45 AM  ?GAD 7 : Generalized Anxiety Score  ?Nervous, Anxious, on Edge 2 2 1 2   ?Control/stop worrying 2 2 1 3   ?Worry too much - different things 2 3 1 3   ?Trouble relaxing 2 2 1 2   ?Restless 2 1 0 1  ?Easily annoyed or irritable 3 3 2 3   ?Afraid - awful might happen 1 2 1 2   ?Total GAD 7 Score 14 15 7 16   ?Anxiety Difficulty Very difficult Somewhat difficult Somewhat difficult Very difficult  ? ? ? ? ? ? ? ?  ?Assessment & Plan:  ?..Lauren Wade was seen today for anxiety. ? ?Diagnoses and all orders for this  visit: ? ?Acute adjustment disorder with mixed anxiety and depressed mood ?-     Ambulatory referral to Psychology ? ?Panic attacks ?-     Ambulatory referral to Psychology ? ?Binge eating disorder ?-     lisdexamfetamine (VYVANSE) 20 MG capsule; Take 1 capsule (20 mg total) by mouth daily. ?-     lisdexamfetamine (VYVANSE) 20 MG capsule; Take 1 capsule (20 mg total) by mouth every morning. ?-     lisdexamfetamine (VYVANSE) 20 MG capsule; Take 1 capsule (20 mg total) by mouth every morning. ? ?PHQ/GAD numbers not to goal.  ?Pt does not respond well to SSRI/SSNRI.  ?Will start with counseling(referral made) and regular exercise, breathing, relaxation techniques.  ?Will restart vyvanse for her binge eating due to stress.  ?Consider OTC calm aid with lavender and/or ashwaganda ?Follow up in 3 months or sooner if needed ? ?Spent 30 minutes with patient discussing transition, self care, medications, counseling and treatment plan.  ? ? ?Return in about 3 months (around 07/10/2022).  ? ? ?03/23/2020, PA-C ? ?

## 2022-04-09 NOTE — Patient Instructions (Addendum)
[facility]idi Birkner or [employer] [facility] ?Calm-aid(lavender)  ? ? ?

## 2022-04-11 ENCOUNTER — Telehealth: Payer: Self-pay

## 2022-04-11 NOTE — Telephone Encounter (Signed)
Initiated Prior authorization [id number] 20MG  capsules ?Via: Covermymeds ?Case/Key:[id number]  ?Status: Pending as of 04/11/22 ?Reason:Coverage Start Date:03/12/2022;Coverage End Date:04/11/2023; ?Notified Pt via: Mychart ? ?

## 2022-04-24 DIAGNOSIS — F4312 Post-traumatic stress disorder, chronic: Secondary | ICD-10-CM | POA: Diagnosis not present

## 2022-05-11 DIAGNOSIS — Z1231 Encounter for screening mammogram for malignant neoplasm of breast: Secondary | ICD-10-CM | POA: Diagnosis not present

## 2022-05-11 LAB — HM MAMMOGRAPHY

## 2022-05-17 DIAGNOSIS — F4312 Post-traumatic stress disorder, chronic: Secondary | ICD-10-CM | POA: Diagnosis not present

## 2022-05-25 ENCOUNTER — Emergency Department
Admission: EM | Admit: 2022-05-25 | Discharge: 2022-05-25 | Disposition: A | Discharge status: Home / Self Care | Payer: BC Managed Care – PPO | Source: Home / Self Care

## 2022-05-25 DIAGNOSIS — H01134 Eczematous dermatitis of left upper eyelid: Secondary | ICD-10-CM

## 2022-05-25 DIAGNOSIS — H01131 Eczematous dermatitis of right upper eyelid: Secondary | ICD-10-CM | POA: Diagnosis not present

## 2022-05-25 MED ORDER — PREDNISONE 10 MG (21) PO TBPK: ORAL_TABLET | ORAL | 0 refills | Status: DC

## 2022-06-21 DIAGNOSIS — F4312 Post-traumatic stress disorder, chronic: Secondary | ICD-10-CM | POA: Diagnosis not present

## 2022-06-29 DIAGNOSIS — F4312 Post-traumatic stress disorder, chronic: Secondary | ICD-10-CM | POA: Diagnosis not present

## 2022-07-04 ENCOUNTER — Encounter: Payer: Self-pay | Admitting: Family Medicine

## 2022-07-04 ENCOUNTER — Emergency Department
Admission: EM | Admit: 2022-07-04 | Discharge: 2022-07-04 | Disposition: A | Discharge status: Home / Self Care | Payer: BC Managed Care – PPO | Attending: Family Medicine | Admitting: Family Medicine

## 2022-07-04 DIAGNOSIS — L237 Allergic contact dermatitis due to plants, except food: Secondary | ICD-10-CM | POA: Diagnosis not present

## 2022-07-04 MED ORDER — PREDNISONE 10 MG (21) PO TBPK: ORAL_TABLET | Freq: Every day | ORAL | 0 refills | Status: DC

## 2022-07-04 NOTE — Discharge Instructions (Addendum)
Instructed patient to take medication as directed with food to completion.  Encouraged patient to increase daily water intake while taking this medications.  Advised patient to change bed linens for the next 3 nights to avoid recontamination.  Advised if symptoms worsen and/or unresolved please follow-up with PCP or here for further evaluation.

## 2022-07-04 NOTE — ED Triage Notes (Signed)
Patient comes in with complaints of rash that started on left arm and right leg that is spreading across body, there is bruising accompanying the rashes. She states the rash is burning and itching.

## 2022-07-04 NOTE — ED Provider Notes (Signed)
[facility]    CSN: [id number] Arrival date & time: 07/04/22  1411      History   Chief Complaint No chief complaint on file.   HPI Lauren Wade is a 41 y.o. female.   HPI 41 year old female presents with a rash that started on left arm is now spreading across body including right lower leg for 4-5 days.  PMH significant for GAD, primary insomnia, and restless leg syndrome.  Past Medical History:  Diagnosis Date   COVID 07/05/2021   IBS (irritable bowel syndrome)     Patient Active Problem List   Diagnosis Date Noted   Acute adjustment disorder with mixed anxiety and depressed mood 04/09/2022   Panic attacks 04/09/2022   Recurrent maxillary sinusitis 03/16/2022   Abnormal color of tongue 05/20/2020   Routine physical examination 03/21/2020   Restless leg syndrome 09/22/2018   Binge eating disorder 08/25/2018   Right kidney stone 07/17/2018   Primary insomnia 03/17/2018   Chronic gastritis without bleeding 03/17/2018   Depressed mood 03/17/2018   Low TSH level 02/19/2018   Chronic idiopathic constipation 02/14/2018   Rectocele 02/11/2018   Epigastric pain 02/11/2018   Black stools 02/11/2018   GAD (generalized anxiety disorder) 01/07/2015    Past Surgical History:  Procedure Laterality Date   ANKLE SURGERY Right 10/08/2020   removal of cervical CA      OB History   No obstetric history on file.      Home Medications    Prior to Admission medications   Medication Sig Start Date End Date Taking? Authorizing Provider  predniSONE (STERAPRED UNI-PAK 21 TAB) 10 MG (21) TBPK tablet Take by mouth daily. Take 6 tabs by mouth daily  for 2 days, then 5 tabs for 2 days, then 4 tabs for 2 days, then 3 tabs for 2 days, 2 tabs for 2 days, then 1 tab by mouth daily for 2 days 07/04/22  Yes Trevor Iha, FNP  lisdexamfetamine (VYVANSE) 20 MG capsule Take 1 capsule (20 mg total) by mouth daily. 04/09/22   Breeback, Jade L, PA-C  lisdexamfetamine (VYVANSE) 20  MG capsule Take 1 capsule (20 mg total) by mouth every morning. 06/09/22   Breeback, Jade L, PA-C  lisdexamfetamine (VYVANSE) 20 MG capsule Take 1 capsule (20 mg total) by mouth every morning. 05/10/22   Jomarie Longs, PA-C    Family History Family History  Problem Relation Age of Onset   Depression Mother    Hyperlipidemia Father    Depression Paternal Aunt     Social History Social History   Tobacco Use   Smoking status: Former    Types: Cigarettes   Smokeless tobacco: Never  Vaping Use   Vaping Use: Some days  Substance Use Topics   Alcohol use: Not Currently   Drug use: No     Allergies   Lexapro [escitalopram oxalate], Paxil [paroxetine hcl], and Zoloft [sertraline hcl]   Review of Systems Review of Systems  Skin:  Positive for rash.  All other systems reviewed and are negative.    Physical Exam Triage Vital Signs ED Triage Vitals  Enc Vitals Group     BP      Pulse      Resp      Temp      Temp src      SpO2      Weight      Height      Head Circumference      Peak Flow  Pain Score      Pain Loc      Pain Edu?      Excl. in GC?    No data found.  Updated Vital Signs BP 129/84 (BP Location: Left Arm)   Pulse 93   Temp 98.3 F (36.8 C) (Oral)   Resp 18   LMP 06/15/2022   SpO2 100%    Physical Exam Vitals and nursing note reviewed.  Constitutional:      Appearance: Normal appearance. She is normal weight.  HENT:     Head: Normocephalic and atraumatic.     Mouth/Throat:     Mouth: Mucous membranes are moist.     Pharynx: Oropharynx is clear.  Eyes:     Extraocular Movements: Extraocular movements intact.     Conjunctiva/sclera: Conjunctivae normal.     Pupils: Pupils are equal, round, and reactive to light.  Cardiovascular:     Rate and Rhythm: Normal rate and regular rhythm.     Pulses: Normal pulses.     Heart sounds: Normal heart sounds. No murmur heard. Pulmonary:     Effort: Pulmonary effort is normal.     Breath  sounds: Normal breath sounds. No wheezing, rhonchi or rales.  Musculoskeletal:     Cervical back: Normal range of motion and neck supple.  Skin:    General: Skin is warm and dry.     Comments: Bilateral lower arms/upper/lower legs/torso (right-sided): Pruritic erythematous maculopapular eruption with linear vesicular lesions noted  Neurological:     General: No focal deficit present.     Mental Status: She is alert and oriented to person, place, and time.      UC Treatments / Results  Labs (all labs ordered are listed, but only abnormal results are displayed) Labs Reviewed - No data to display  EKG   Radiology No results found.  Procedures Procedures (including critical care time)  Medications Ordered in UC Medications - No data to display  Initial Impression / Assessment and Plan / UC Course  I have reviewed the triage vital signs and the nursing notes.  Pertinent labs & imaging results that were available during my care of the patient were reviewed by me and considered in my medical decision making (see chart for details).     MDM: 1.  Poison ivy dermatitis-Rx'd Sterapred Unipak. Instructed patient to take medication as directed with food to completion.  Encouraged patient to increase daily water intake while taking this medications.  Advised patient to change bed linens for the next 3 nights to avoid recontamination.  Advised if symptoms worsen and/or unresolved please follow-up with PCP or here for further evaluation. Final Clinical Impressions(s) / UC Diagnoses   Final diagnoses:  Poison ivy dermatitis     Discharge Instructions      Instructed patient to take medication as directed with food to completion.  Encouraged patient to increase daily water intake while taking this medications.  Advised patient to change bed linens for the next 3 nights to avoid recontamination.  Advised if symptoms worsen and/or unresolved please follow-up with PCP or here for further  evaluation.     ED Prescriptions     Medication Sig Dispense Auth. Provider   predniSONE (STERAPRED UNI-PAK 21 TAB) 10 MG (21) TBPK tablet Take by mouth daily. Take 6 tabs by mouth daily  for 2 days, then 5 tabs for 2 days, then 4 tabs for 2 days, then 3 tabs for 2 days, 2 tabs for 2 days, then 1  tab by mouth daily for 2 days 42 tablet Trevor Iha, FNP      PDMP not reviewed this encounter.   Trevor Iha, FNP 07/04/22 1505

## 2022-07-05 ENCOUNTER — Telehealth: Payer: Self-pay

## 2022-07-05 NOTE — Telephone Encounter (Signed)
Called pt to check on status since UC visit. No answer. Left VM advising to call back if any questions or concerns. 

## 2022-07-10 ENCOUNTER — Ambulatory Visit: Payer: BC Managed Care – PPO | Admitting: Physician Assistant

## 2022-07-13 ENCOUNTER — Other Ambulatory Visit: Payer: Self-pay | Admitting: Neurology

## 2022-07-16 ENCOUNTER — Encounter: Payer: Self-pay | Admitting: Physician Assistant

## 2022-07-16 ENCOUNTER — Ambulatory Visit: Payer: BC Managed Care – PPO | Admitting: Physician Assistant

## 2022-07-16 VITALS — BP 118/84 | HR 86 | Ht 61.0 in | Wt 141.0 lb

## 2022-07-16 DIAGNOSIS — Z131 Encounter for screening for diabetes mellitus: Secondary | ICD-10-CM | POA: Diagnosis not present

## 2022-07-16 DIAGNOSIS — R233 Spontaneous ecchymoses: Secondary | ICD-10-CM

## 2022-07-16 DIAGNOSIS — Z79899 Other long term (current) drug therapy: Secondary | ICD-10-CM

## 2022-07-16 DIAGNOSIS — F5081 Binge eating disorder: Secondary | ICD-10-CM | POA: Diagnosis not present

## 2022-07-16 DIAGNOSIS — Z13220 Encounter for screening for lipoid disorders: Secondary | ICD-10-CM

## 2022-07-16 DIAGNOSIS — R7989 Other specified abnormal findings of blood chemistry: Secondary | ICD-10-CM

## 2022-07-16 MED ORDER — LISDEXAMFETAMINE DIMESYLATE 30 MG PO CAPS: 30.0000 mg | ORAL_CAPSULE | Freq: Every day | ORAL | 0 refills | Status: DC

## 2022-07-16 NOTE — Progress Notes (Unsigned)
Established Patient Office Visit  Subjective   Patient ID: Lauren Wade, female    DOB: [birth year]  Age: 41 y.o. MRN: [id number]  Chief Complaint  Patient presents with   Follow-up   ADHD    HPI Pt is a 41 yo female who presents to the clinic for follow up.   She is doing better with mood. Her and her husband are working on their marriage. She has taken the summer to take care of herself and family. She continues to use food as a crutch when she gets stressed. Vyvanse helps but wonders if she could increase the dose. No SI/HC.     ROS See HPI.    Objective:     BP 118/84   Pulse 86   Ht 5\' 1"  (1.549 m)   Wt 141 lb (64 kg)   LMP 06/15/2022   SpO2 100%   BMI 26.64 kg/m  BP Readings from Last 3 Encounters:  07/16/22 118/84  07/04/22 129/84  04/09/22 (!) 110/93   Wt Readings from Last 3 Encounters:  07/16/22 141 lb (64 kg)  04/09/22 146 lb (66.2 kg)  03/16/22 140 lb 1.3 oz (63.5 kg)    ..    07/17/2022    7:23 AM 04/09/2022    8:27 AM 03/21/2020   10:45 AM 01/16/2019   10:22 AM 07/15/2018   10:44 AM  Depression screen PHQ 2/9  Decreased Interest 1 3 2 1 2   Down, Depressed, Hopeless 1 2 1 1 2   PHQ - 2 Score 2 5 3 2 4   Altered sleeping 0 2 1 3 1   Tired, decreased energy 1 2 3 2 2   Change in appetite 1 3 3 1 3   Feeling bad or failure about yourself  0 2 3 1 2   Trouble concentrating 2 3 1 1 3   Moving slowly or fidgety/restless 0 3 0 0 0  Suicidal thoughts 0 0 0 0 0  PHQ-9 Score 6 20 14 10 15   Difficult doing work/chores Somewhat difficult Very difficult Somewhat difficult Somewhat difficult Very difficult   .4/9    07/17/2022    7:23 AM 04/09/2022    8:29 AM 03/21/2020   10:45 AM 01/16/2019   10:22 AM  GAD 7 : Generalized Anxiety Score  Nervous, Anxious, on Edge 1 2 2 1   Control/stop worrying 1 2 2 1   Worry too much - different things 1 2 3 1   Trouble relaxing 1 2 2 1   Restless 1 2 1  0  Easily annoyed or irritable 1 3 3 2   Afraid - awful might happen 1  1 2 1   Total GAD 7 Score 7 14 15 7   Anxiety Difficulty Somewhat difficult Very difficult Somewhat difficult Somewhat difficult      Physical Exam Constitutional:      Appearance: Normal appearance.  HENT:     Head: Normocephalic.  Cardiovascular:     Rate and Rhythm: Normal rate and regular rhythm.     Pulses: Normal pulses.     Heart sounds: Normal heart sounds.  Pulmonary:     Effort: Pulmonary effort is normal.     Breath sounds: Normal breath sounds.  Neurological:     General: No focal deficit present.     Mental Status: She is alert and oriented to person, place, and time.  Psychiatric:        Mood and Affect: Mood normal.         Assessment & Plan:   Lauren Wade was  seen today for follow-up and adhd.  Diagnoses and all orders for this visit:  Binge eating disorder -     lisdexamfetamine (VYVANSE) 30 MG capsule; Take 1 capsule (30 mg total) by mouth daily. -     lisdexamfetamine (VYVANSE) 30 MG capsule; Take 1 capsule (30 mg total) by mouth daily. -     lisdexamfetamine (VYVANSE) 30 MG capsule; Take 1 capsule (30 mg total) by mouth daily.  Low TSH level -     TSH  Lipid screening -     Lipid Panel w/reflex Direct LDL  Diabetes mellitus screening -     COMPLETE METABOLIC PANEL WITH GFR  Medication management -     TSH -     Lipid Panel w/reflex Direct LDL -     COMPLETE METABOLIC PANEL WITH GFR -     CBC with Differential/Platelet  Easy bruising -     CBC with Differential/Platelet   PHQ/GAD improving In counseling Continues to binge eat some but seems to be improving Increased to 30mg  daily Continue regular exercise   Return in about 3 months (around 10/16/2022).    10/18/2022, PA-C

## 2022-07-17 DIAGNOSIS — Z131 Encounter for screening for diabetes mellitus: Secondary | ICD-10-CM | POA: Diagnosis not present

## 2022-07-17 DIAGNOSIS — R7989 Other specified abnormal findings of blood chemistry: Secondary | ICD-10-CM | POA: Diagnosis not present

## 2022-07-17 DIAGNOSIS — Z13220 Encounter for screening for lipoid disorders: Secondary | ICD-10-CM | POA: Diagnosis not present

## 2022-07-17 DIAGNOSIS — Z79899 Other long term (current) drug therapy: Secondary | ICD-10-CM | POA: Diagnosis not present

## 2022-07-18 DIAGNOSIS — F4312 Post-traumatic stress disorder, chronic: Secondary | ICD-10-CM | POA: Diagnosis not present

## 2022-07-18 LAB — CBC WITH DIFFERENTIAL/PLATELET
Absolute Monocytes: 562 cells/uL (ref 200–950)
Basophils Absolute: 22 cells/uL (ref 0–200)
Basophils Relative: 0.3 %
Eosinophils Absolute: 248 cells/uL (ref 15–500)
Eosinophils Relative: 3.4 %
HCT: 41.1 % (ref 35.0–45.0)
Hemoglobin: 13.6 g/dL (ref 11.7–15.5)
Lymphs Abs: 2248 cells/uL (ref 850–3900)
MCH: 31.1 pg (ref 27.0–33.0)
MCHC: 33.1 g/dL (ref 32.0–36.0)
MCV: 93.8 fL (ref 80.0–100.0)
MPV: 10.4 fL (ref 7.5–12.5)
Monocytes Relative: 7.7 %
Neutro Abs: 4219 cells/uL (ref 1500–7800)
Neutrophils Relative %: 57.8 %
Platelets: 256 10*3/uL (ref 140–400)
RBC: 4.38 10*6/uL (ref 3.80–5.10)
RDW: 12 % (ref 11.0–15.0)
Total Lymphocyte: 30.8 %
WBC: 7.3 10*3/uL (ref 3.8–10.8)

## 2022-07-18 LAB — COMPLETE METABOLIC PANEL WITH GFR
AG Ratio: 1.8 (calc) (ref 1.0–2.5)
ALT: 10 U/L (ref 6–29)
AST: 11 U/L (ref 10–30)
Albumin: 3.9 g/dL (ref 3.6–5.1)
Alkaline phosphatase (APISO): 55 U/L (ref 31–125)
BUN: 12 mg/dL (ref 7–25)
CO2: 25 mmol/L (ref 20–32)
Calcium: 9.2 mg/dL (ref 8.6–10.2)
Chloride: 106 mmol/L (ref 98–110)
Creat: 0.85 mg/dL (ref 0.50–0.99)
Globulin: 2.2 g/dL (calc) (ref 1.9–3.7)
Glucose, Bld: 79 mg/dL (ref 65–99)
Potassium: 4.5 mmol/L (ref 3.5–5.3)
Sodium: 140 mmol/L (ref 135–146)
Total Bilirubin: 0.5 mg/dL (ref 0.2–1.2)
Total Protein: 6.1 g/dL (ref 6.1–8.1)
eGFR: 89 mL/min/{1.73_m2} (ref >=60)

## 2022-07-18 LAB — TSH: TSH: 0.57 mIU/L

## 2022-07-18 LAB — LIPID PANEL W/REFLEX DIRECT LDL
Cholesterol: 188 mg/dL (ref <200)
HDL: 71 mg/dL (ref >=50)
LDL Cholesterol (Calc): 99 mg/dL (calc)
Non-HDL Cholesterol (Calc): 117 mg/dL (calc) (ref <130)
Total CHOL/HDL Ratio: 2.6 (calc) (ref <5.0)
Triglycerides: 87 mg/dL (ref <150)

## 2022-07-18 NOTE — Progress Notes (Signed)
Lauren Wade,   Thyroid normal range.  Cholesterol has improved! Looks great.  Kidney, liver, glucose look wonderful.  Hemoglobin normal.   GREAT labs.

## 2022-07-26 DIAGNOSIS — E663 Overweight: Secondary | ICD-10-CM | POA: Diagnosis not present

## 2022-07-26 DIAGNOSIS — Z6826 Body mass index (BMI) 26.0-26.9, adult: Secondary | ICD-10-CM | POA: Diagnosis not present

## 2022-08-01 ENCOUNTER — Encounter: Payer: BC Managed Care – PPO | Admitting: Physician Assistant

## 2022-08-01 DIAGNOSIS — Z124 Encounter for screening for malignant neoplasm of cervix: Secondary | ICD-10-CM

## 2022-08-01 DIAGNOSIS — F4312 Post-traumatic stress disorder, chronic: Secondary | ICD-10-CM | POA: Diagnosis not present

## 2022-08-18 ENCOUNTER — Encounter: Payer: Self-pay | Admitting: Physician Assistant

## 2022-09-12 DIAGNOSIS — F4312 Post-traumatic stress disorder, chronic: Secondary | ICD-10-CM | POA: Diagnosis not present

## 2022-09-18 DIAGNOSIS — F4312 Post-traumatic stress disorder, chronic: Secondary | ICD-10-CM | POA: Diagnosis not present

## 2022-10-17 DIAGNOSIS — F4312 Post-traumatic stress disorder, chronic: Secondary | ICD-10-CM | POA: Diagnosis not present

## 2022-10-22 ENCOUNTER — Ambulatory Visit (INDEPENDENT_AMBULATORY_CARE_PROVIDER_SITE_OTHER): Payer: BC Managed Care – PPO

## 2022-10-22 ENCOUNTER — Ambulatory Visit: Payer: BC Managed Care – PPO | Admitting: Physician Assistant

## 2022-10-22 ENCOUNTER — Encounter: Payer: Self-pay | Admitting: Physician Assistant

## 2022-10-22 VITALS — BP 122/80 | HR 89 | Ht 61.0 in | Wt 138.0 lb

## 2022-10-22 DIAGNOSIS — R7989 Other specified abnormal findings of blood chemistry: Secondary | ICD-10-CM | POA: Diagnosis not present

## 2022-10-22 DIAGNOSIS — R0789 Other chest pain: Secondary | ICD-10-CM | POA: Diagnosis not present

## 2022-10-22 DIAGNOSIS — R079 Chest pain, unspecified: Secondary | ICD-10-CM

## 2022-10-22 DIAGNOSIS — R0602 Shortness of breath: Secondary | ICD-10-CM | POA: Diagnosis not present

## 2022-10-22 DIAGNOSIS — F411 Generalized anxiety disorder: Secondary | ICD-10-CM

## 2022-10-22 LAB — IRON,TIBC AND FERRITIN PANEL
%SAT: 24 % (ref 16–45)
Ferritin: 41 ng/mL (ref 16–154)
Iron: 67 ug/dL (ref 40–190)
TIBC: 285 ug/dL (ref 250–450)

## 2022-10-22 LAB — CBC WITH DIFFERENTIAL/PLATELET
Absolute Monocytes: 400 cells/uL (ref 200–950)
Basophils Absolute: 32 cells/uL (ref 0–200)
Basophils Relative: 0.4 %
Eosinophils Absolute: 176 cells/uL (ref 15–500)
Eosinophils Relative: 2.2 %
HCT: 37.4 % (ref 35.0–45.0)
Hemoglobin: 12.7 g/dL (ref 11.7–15.5)
Lymphs Abs: 3032 cells/uL (ref 850–3900)
MCH: 31.3 pg (ref 27.0–33.0)
MCHC: 34 g/dL (ref 32.0–36.0)
MCV: 92.1 fL (ref 80.0–100.0)
MPV: 11 fL (ref 7.5–12.5)
Monocytes Relative: 5 %
Neutro Abs: 4360 cells/uL (ref 1500–7800)
Neutrophils Relative %: 54.5 %
Platelets: 249 10*3/uL (ref 140–400)
RBC: 4.06 10*6/uL (ref 3.80–5.10)
RDW: 11.5 % (ref 11.0–15.0)
Total Lymphocyte: 37.9 %
WBC: 8 10*3/uL (ref 3.8–10.8)

## 2022-10-22 LAB — BASIC METABOLIC PANEL WITHOUT GFR
BUN: 11 mg/dL (ref 7–25)
CO2: 26 mmol/L (ref 20–32)
Calcium: 9.3 mg/dL (ref 8.6–10.2)
Chloride: 104 mmol/L (ref 98–110)
Creat: 0.77 mg/dL (ref 0.50–0.99)
Glucose, Bld: 80 mg/dL (ref 65–99)
Potassium: 4.1 mmol/L (ref 3.5–5.3)
Sodium: 138 mmol/L (ref 135–146)
eGFR: 100 mL/min/1.73m2

## 2022-10-22 LAB — TROPONIN I: Troponin I: 3 ng/L

## 2022-10-22 LAB — SEDIMENTATION RATE: Sed Rate: 6 mm/h (ref 0–20)

## 2022-10-22 LAB — CK TOTAL AND CKMB (NOT AT ARMC)
CK, MB: <0.7 ng/mL (ref 0–5.0)
Total CK: 54 U/L (ref 29–143)

## 2022-10-22 LAB — MAGNESIUM: Magnesium: 1.9 mg/dL (ref 1.5–2.5)

## 2022-10-22 LAB — B12 AND FOLATE PANEL
Folate: 14.7 ng/mL
Vitamin B-12: 467 pg/mL (ref 200–1100)

## 2022-10-22 LAB — TSH: TSH: 0.63 m[IU]/L

## 2022-10-22 LAB — D-DIMER, QUANTITATIVE: D-Dimer, Quant: 0.19 mcg/mL FEU (ref <0.50)

## 2022-10-22 LAB — VITAMIN D 25 HYDROXY (VIT D DEFICIENCY, FRACTURES): Vit D, 25-Hydroxy: 25 ng/mL — ABNORMAL LOW (ref 30–100)

## 2022-10-22 MED ORDER — METHYLPREDNISOLONE 4 MG PO TBPK: ORAL_TABLET | ORAL | 0 refills | Status: DC

## 2022-10-22 MED ORDER — KETOROLAC TROMETHAMINE 60 MG/2ML IM SOLN
60.0000 mg | Freq: Once | INTRAMUSCULAR | Status: AC
  Administered 2022-10-22: 60 mg via INTRAMUSCULAR

## 2022-10-22 NOTE — Progress Notes (Signed)
CXR looks great!

## 2022-10-22 NOTE — Progress Notes (Signed)
Acute Office Visit  Subjective:     Patient ID: Lauren Wade, female    DOB: 11-22-1981, 41 y.o.   MRN: [id number]  Chief Complaint  Patient presents with   Chest Pain    HPI Patient is in today for left sided chest pain and sternal pain for the last week. She had a similar episode in January 2023 that resolved with Toradol shot and medrol dose pack. She had normal CXR and EKG then.   She has not been sick. She denies any injury. She denies any fever, cough, chills, body aches, neck pain. Pain starts in sternal area and radiates left chest wall and into left arm. She is short of breath at times especially when her pain increases. Last night it was so bad she felt like she could not take a deep breath in. At times it can be worse with exertion but also when she is sitting still as well. No lower ext edema.   .. Active Ambulatory Problems    Diagnosis Date Noted   GAD (generalized anxiety disorder) 01/07/2015   Rectocele 02/11/2018   Epigastric pain 02/11/2018   Black stools 02/11/2018   Chronic idiopathic constipation 02/14/2018   Low TSH level 02/19/2018   Primary insomnia 03/17/2018   Chronic gastritis without bleeding 03/17/2018   Depressed mood 03/17/2018   Right kidney stone 07/17/2018   Binge eating disorder 08/25/2018   Restless leg syndrome 09/22/2018   Easy bruising 09/22/2018   Routine physical examination 03/21/2020   Abnormal color of tongue 05/20/2020   Recurrent maxillary sinusitis 03/16/2022   Acute adjustment disorder with mixed anxiety and depressed mood 04/09/2022   Panic attacks 04/09/2022   Chest pain 10/22/2022   SOB (shortness of breath) 10/22/2022   Sternum pain 10/22/2022   Resolved Ambulatory Problems    Diagnosis Date Noted   BRONCHITIS 01/21/2010   Cervical dysplasia 01/07/2015   Abnormal weight gain 01/07/2015   Class 2 obesity due to excess calories without serious comorbidity with body mass index (BMI) of 36.0 to 36.9 in adult  01/07/2015   Right shoulder pain 01/11/2015   RUQ pain 05/05/2015   Irritability and anger 07/17/2018   Mood changes 07/17/2018   Acute bronchitis 11/10/2018   Overweight (BMI 25.0-29.9) 01/16/2019   Ear pressure, right 05/20/2020   Past Medical History:  Diagnosis Date   COVID 07/05/2021   IBS (irritable bowel syndrome)      ROS  See HPI.     Objective:    BP 122/80   Pulse 89   Ht _0  (1.549 m)   Wt 138 lb (62.6 kg)   SpO2 100%   BMI 26.07 kg/m  BP Readings from Last 3 Encounters:  10/22/22 122/80  07/16/22 118/84  07/04/22 129/84   Wt Readings from Last 3 Encounters:  10/22/22 138 lb (62.6 kg)  07/16/22 141 lb (64 kg)  04/09/22 146 lb (66.2 kg)      Physical Exam Constitutional:      Appearance: She is well-developed.  HENT:     Head: Normocephalic.  Cardiovascular:     Rate and Rhythm: Normal rate and regular rhythm.     Heart sounds: Normal heart sounds.  Pulmonary:     Effort: Pulmonary effort is normal.  Chest:     Chest wall: Tenderness present. No mass or edema.     Comments: Tenderness over sternum and into left chest wall.  Musculoskeletal:     Right lower leg: No edema.  Left lower leg: No edema.     Comments: NROM of bilateral shoulders and neck.  Strength upper ext 5/5.   Neurological:     General: No focal deficit present.     Mental Status: She is alert.          Assessment & Plan:  Marland KitchenMarland KitchenAmber was seen today for chest pain.  Diagnoses and all orders for this visit:  Left-sided chest pain -     EKG 12-Lead -     D-Dimer, Quantitative -     CBC w/Diff/Platelet -     Sed Rate (ESR) -     BASIC METABOLIC PANEL WITH GFR -     Troponin I - -     CK total and CKMB (cardiac)not at [facility] -     B12 and Folate Panel -     VITAMIN D 25 Hydroxy (Vit-D Deficiency, Fractures) -     Magnesium -     TSH -     Fe+TIBC+Fer -     DG Chest 2 View; Future -     ketorolac (TORADOL) injection 60 mg  SOB (shortness of breath) -      D-Dimer, Quantitative -     CBC w/Diff/Platelet -     Sed Rate (ESR) -     BASIC METABOLIC PANEL WITH GFR -     Troponin I - -     CK total and CKMB (cardiac)not at [facility] -     B12 and Folate Panel -     VITAMIN D 25 Hydroxy (Vit-D Deficiency, Fractures) -     Magnesium -     TSH -     Fe+TIBC+Fer -     DG Chest 2 View; Future  Low TSH level -     TSH -     ketorolac (TORADOL) injection 60 mg  Sternum pain -     D-Dimer, Quantitative -     CBC w/Diff/Platelet -     Sed Rate (ESR) -     BASIC METABOLIC PANEL WITH GFR -     Troponin I - -     CK total and CKMB (cardiac)not at [facility] -     B12 and Folate Panel -     VITAMIN D 25 Hydroxy (Vit-D Deficiency, Fractures) -     Magnesium -     TSH -     Fe+TIBC+Fer -     methylPREDNISolone (MEDROL DOSEPAK) 4 MG TBPK tablet; Take as directed by package insert. -     DG Chest 2 View; Future -     ketorolac (TORADOL) injection 60 mg  GAD (generalized anxiety disorder)   EKG-NSR/no ST elevation/no changes from last EKG.  Vitals are reassuring.  Pulse ox is 100 percent.  Will get CXR. Will get cardiac stat labs and d-dimer.  D-dimer elevated will get CTA.  No GI symptoms to suggest gastritis.  Could be costochondritis again Toradol 5m IM and medrol dose pack sent to start Will call with labs and treat accordingly.    JIran Planas PA-C

## 2022-10-22 NOTE — Patient Instructions (Signed)
Toradol given today Get labs today.  Start medrol dose pack

## 2022-10-23 NOTE — Progress Notes (Signed)
Letina,   Iron and iron stores look good.  Thyroid looks great.  Magnesium normal range.  B12 looks good.  Folate looks great.  Cardiac enzymes low and low d-dimer-GREAT news.  Kidney and electrolytes look good.  Normal inflammatory rate Normal WBC Vitamin D low- double what you are taking and make sure taking with diary/fatty meal.

## 2022-10-24 DIAGNOSIS — F4312 Post-traumatic stress disorder, chronic: Secondary | ICD-10-CM | POA: Diagnosis not present

## 2022-11-07 DIAGNOSIS — F4312 Post-traumatic stress disorder, chronic: Secondary | ICD-10-CM | POA: Diagnosis not present

## 2022-12-04 DIAGNOSIS — F4312 Post-traumatic stress disorder, chronic: Secondary | ICD-10-CM | POA: Diagnosis not present

## 2023-01-24 ENCOUNTER — Encounter: Payer: Self-pay | Admitting: Physician Assistant

## 2023-01-24 ENCOUNTER — Ambulatory Visit: Payer: BC Managed Care – PPO | Admitting: Physician Assistant

## 2023-01-24 VITALS — BP 122/83 | HR 84 | Ht 61.0 in | Wt 145.0 lb

## 2023-01-24 DIAGNOSIS — R0789 Other chest pain: Secondary | ICD-10-CM

## 2023-01-24 DIAGNOSIS — F411 Generalized anxiety disorder: Secondary | ICD-10-CM

## 2023-01-24 DIAGNOSIS — F439 Reaction to severe stress, unspecified: Secondary | ICD-10-CM | POA: Diagnosis not present

## 2023-01-24 MED ORDER — KETOROLAC TROMETHAMINE 60 MG/2ML IM SOLN
60.0000 mg | Freq: Once | INTRAMUSCULAR | Status: AC
  Administered 2023-01-24: 60 mg via INTRAMUSCULAR

## 2023-01-24 NOTE — Progress Notes (Signed)
Acute Office Visit  Subjective:     Patient ID: Lauren Wade, female    DOB: [birth year], 42 y.o.   MRN: [id number]  Chief Complaint  Patient presents with   Chest Pain    Epigastric/esophageal    HPI Patient is in today for exacerbation of her left sided chest wall pain that she has had 2-3 times before. She has had EKGs, cardiac enzymes, chest xrays, d-dimers in the past and all been negative. She denies any GI symptoms. Toradol alone helped last visit. She never took medrol dose pack. The only correlating factor is stress and testing. When she has a lot of stressful testing this seems to happen. Her left chest wall and sternum is tender. No URI symptoms or cough. No injury.  She did stretch the other day and feel a pop in her sternum. Not painful to breath or move arms.   .. Active Ambulatory Problems    Diagnosis Date Noted   GAD (generalized anxiety disorder) 01/07/2015   Rectocele 02/11/2018   Epigastric pain 02/11/2018   Black stools 02/11/2018   Chronic idiopathic constipation 02/14/2018   Low TSH level 02/19/2018   Primary insomnia 03/17/2018   Chronic gastritis without bleeding 03/17/2018   Depressed mood 03/17/2018   Right kidney stone 07/17/2018   Binge eating disorder 08/25/2018   Restless leg syndrome 09/22/2018   Easy bruising 09/22/2018   Routine physical examination 03/21/2020   Abnormal color of tongue 05/20/2020   Recurrent maxillary sinusitis 03/16/2022   Acute adjustment disorder with mixed anxiety and depressed mood 04/09/2022   Panic attacks 04/09/2022   Left-sided chest wall pain 10/22/2022   SOB (shortness of breath) 10/22/2022   Sternum pain 10/22/2022   Stress 01/24/2023   Resolved Ambulatory Problems    Diagnosis Date Noted   BRONCHITIS 01/21/2010   Cervical dysplasia 01/07/2015   Abnormal weight gain 01/07/2015   Class 2 obesity due to excess calories without serious comorbidity with body mass index (BMI) of 36.0 to 36.9 in adult  01/07/2015   Right shoulder pain 01/11/2015   RUQ pain 05/05/2015   Irritability and anger 07/17/2018   Mood changes 07/17/2018   Acute bronchitis 11/10/2018   Overweight (BMI 25.0-29.9) 01/16/2019   Ear pressure, right 05/20/2020   Past Medical History:  Diagnosis Date   COVID 07/05/2021   IBS (irritable bowel syndrome)      ROS See HPI.      Objective:    BP 122/83   Pulse 84   Ht '5\' 1"'$  (1.549 m)   Wt 145 lb (65.8 kg)   SpO2 100%   BMI 27.40 kg/m  BP Readings from Last 3 Encounters:  01/24/23 122/83  10/22/22 122/80  07/16/22 118/84   Wt Readings from Last 3 Encounters:  01/24/23 145 lb (65.8 kg)  10/22/22 138 lb (62.6 kg)  07/16/22 141 lb (64 kg)    ..    01/24/2023    2:12 PM 07/17/2022    7:23 AM 04/09/2022    8:27 AM 03/21/2020   10:45 AM 01/16/2019   10:22 AM  Depression screen PHQ 2/9  Decreased Interest '2 1 3 2 1  '$ Down, Depressed, Hopeless '2 1 2 1 1  '$ PHQ - 2 Score '4 2 5 3 2  '$ Altered sleeping 2 0 '2 1 3  '$ Tired, decreased energy '3 1 2 3 2  '$ Change in appetite 0 '1 3 3 1  '$ Feeling bad or failure about yourself  2 0 2 3 1  Trouble concentrating '3 2 3 1 1  '$ Moving slowly or fidgety/restless 0 0 3 0 0  Suicidal thoughts 0 0 0 0 0  PHQ-9 Score '14 6 20 14 10  '$ Difficult doing work/chores Somewhat difficult Somewhat difficult Very difficult Somewhat difficult Somewhat difficult   ..    01/24/2023    2:12 PM 07/17/2022    7:23 AM 04/09/2022    8:29 AM 03/21/2020   10:45 AM  GAD 7 : Generalized Anxiety Score  Nervous, Anxious, on Edge '2 1 2 2  '$ Control/stop worrying '3 1 2 2  '$ Worry too much - different things '3 1 2 3  '$ Trouble relaxing '2 1 2 2  '$ Restless 0 '1 2 1  '$ Easily annoyed or irritable '2 1 3 3  '$ Afraid - awful might happen '1 1 1 2  '$ Total GAD 7 Score '13 7 14 15  '$ Anxiety Difficulty Somewhat difficult Somewhat difficult Very difficult Somewhat difficult      Physical Exam Constitutional:      Appearance: She is well-developed.  HENT:     Head:  Normocephalic.  Cardiovascular:     Rate and Rhythm: Normal rate and regular rhythm.     Heart sounds: Normal heart sounds.  Pulmonary:     Effort: Pulmonary effort is normal.  Chest:     Chest wall: Tenderness present.     Comments: Tenderness to palpation over lower sternum and into the left chest wall.  Abdominal:     General: Bowel sounds are normal. There is no distension.     Palpations: Abdomen is soft. There is no mass.     Tenderness: There is no abdominal tenderness. There is no right CVA tenderness, left CVA tenderness, guarding or rebound.     Hernia: No hernia is present.  Musculoskeletal:     Cervical back: Normal range of motion and neck supple.     Right lower leg: No edema.     Left lower leg: No edema.  Lymphadenopathy:     Cervical: No cervical adenopathy.  Neurological:     Mental Status: She is alert and oriented to person, place, and time.  Psychiatric:        Mood and Affect: Mood normal.         Assessment & Plan:  Marland KitchenMarland KitchenAmber was seen today for chest pain.  Diagnoses and all orders for this visit:  Left-sided chest wall pain -     ketorolac (TORADOL) injection 60 mg -     Ambulatory referral to Chiropractic  Stress  GAD (generalized anxiety disorder)   Vitals look great PHQ/GAD not to goal Pt declines any medication for anxiety or depression Discussed ways to reduce stress/practice deep breathing No signs or symptoms of indigestion/gastritis Cardiac work up historically been negative and not presenting like cardiac chest pain She is actually tender to palpation Toradol '60mg'$  IM in office given Suggested chiropractor for alignment  Ice chest  Tylenol as needed Follow up if symptoms changing or worsening  Spent 37 minutes with patient, reviewing chart, discussing plan.    Iran Planas, PA-C

## 2023-01-25 ENCOUNTER — Encounter: Payer: Self-pay | Admitting: Physician Assistant

## 2023-01-30 DIAGNOSIS — M9903 Segmental and somatic dysfunction of lumbar region: Secondary | ICD-10-CM | POA: Diagnosis not present

## 2023-01-30 DIAGNOSIS — M546 Pain in thoracic spine: Secondary | ICD-10-CM | POA: Diagnosis not present

## 2023-01-30 DIAGNOSIS — M9902 Segmental and somatic dysfunction of thoracic region: Secondary | ICD-10-CM | POA: Diagnosis not present

## 2023-01-30 DIAGNOSIS — M9904 Segmental and somatic dysfunction of sacral region: Secondary | ICD-10-CM | POA: Diagnosis not present

## 2023-02-01 DIAGNOSIS — M9903 Segmental and somatic dysfunction of lumbar region: Secondary | ICD-10-CM | POA: Diagnosis not present

## 2023-02-01 DIAGNOSIS — M9904 Segmental and somatic dysfunction of sacral region: Secondary | ICD-10-CM | POA: Diagnosis not present

## 2023-02-01 DIAGNOSIS — M546 Pain in thoracic spine: Secondary | ICD-10-CM | POA: Diagnosis not present

## 2023-02-01 DIAGNOSIS — M9902 Segmental and somatic dysfunction of thoracic region: Secondary | ICD-10-CM | POA: Diagnosis not present

## 2023-02-05 ENCOUNTER — Telehealth: Payer: Self-pay | Admitting: Physician Assistant

## 2023-02-05 DIAGNOSIS — F5081 Binge eating disorder: Secondary | ICD-10-CM

## 2023-02-05 MED ORDER — LISDEXAMFETAMINE DIMESYLATE 30 MG PO CAPS: 30.0000 mg | ORAL_CAPSULE | Freq: Every day | ORAL | 0 refills | Status: DC

## 2023-02-05 NOTE — Telephone Encounter (Signed)
Sent refill

## 2023-02-05 NOTE — Telephone Encounter (Signed)
Pt called requesting a refill of lisdexamfetamine (VYVANSE) 30 MG capsule [id number] .

## 2023-02-11 DIAGNOSIS — M9904 Segmental and somatic dysfunction of sacral region: Secondary | ICD-10-CM | POA: Diagnosis not present

## 2023-02-11 DIAGNOSIS — M9902 Segmental and somatic dysfunction of thoracic region: Secondary | ICD-10-CM | POA: Diagnosis not present

## 2023-02-11 DIAGNOSIS — M9905 Segmental and somatic dysfunction of pelvic region: Secondary | ICD-10-CM | POA: Diagnosis not present

## 2023-02-11 DIAGNOSIS — M9903 Segmental and somatic dysfunction of lumbar region: Secondary | ICD-10-CM | POA: Diagnosis not present

## 2023-05-13 DIAGNOSIS — R92333 Mammographic heterogeneous density, bilateral breasts: Secondary | ICD-10-CM | POA: Diagnosis not present

## 2023-05-13 DIAGNOSIS — Z1231 Encounter for screening mammogram for malignant neoplasm of breast: Secondary | ICD-10-CM | POA: Diagnosis not present

## 2023-05-13 LAB — HM MAMMOGRAPHY

## 2023-05-21 ENCOUNTER — Telehealth (INDEPENDENT_AMBULATORY_CARE_PROVIDER_SITE_OTHER): Payer: BC Managed Care – PPO | Admitting: Family Medicine

## 2023-05-21 ENCOUNTER — Encounter: Payer: Self-pay | Admitting: Physician Assistant

## 2023-05-21 ENCOUNTER — Encounter: Payer: Self-pay | Admitting: Family Medicine

## 2023-05-21 VITALS — Ht 61.0 in | Wt 145.0 lb

## 2023-05-21 DIAGNOSIS — J0100 Acute maxillary sinusitis, unspecified: Secondary | ICD-10-CM

## 2023-05-21 MED ORDER — DOXYCYCLINE HYCLATE 100 MG PO TABS: 100.0000 mg | ORAL_TABLET | Freq: Two times a day (BID) | ORAL | 0 refills | Status: AC

## 2023-05-21 NOTE — Progress Notes (Signed)
Lauren Wade - 42 y.o. female MRN [id number]  Date of birth: [birth year]   This visit type was conducted due to national recommendations for restrictions regarding the COVID-19 Pandemic (e.g. social distancing).  This format is felt to be most appropriate for this patient at this time.  All issues noted in this document were discussed and addressed.  No physical exam was performed (except for noted visual exam findings with Video Visits).  I discussed the limitations of evaluation and management by telemedicine and the availability of in person appointments. The patient expressed understanding and agreed to proceed.  I connected withNAME@ on 05/21/23 at  3:10 PM EDT by a video enabled telemedicine application and verified that I am speaking with the correct person using two identifiers.  Present at visit: Everrett Coombe, DO Lauren Wade   Patient Location: Home [address] [place] [address]   Provider location:   [facility]  Chief Complaint  Patient presents with   URI    HPI  Lauren Wade is a 42 y.o. female who presents via audio/video conferencing for a telehealth visit today.  She is complaining of cough, nasal congestion and left-sided sinus pain with thickening, yellow mucus secretions.  Symptoms started about 5 days ago and have been worsening.  She is unsure if she has had fever but has felt chills.  Denies GI symptoms, body aches or difficulty breathing.  She has tried Mucinex with some relief.  She is pushing fluids.   ROS:  A comprehensive ROS was completed and negative except as noted per HPI  Past Medical History:  Diagnosis Date   COVID 07/05/2021   IBS (irritable bowel syndrome)     Past Surgical History:  Procedure Laterality Date   ANKLE SURGERY Right 10/08/2020   removal of cervical CA      Family History  Problem Relation Age of Onset   Depression Mother    Hyperlipidemia Father    Depression Paternal Aunt     Social History    Socioeconomic History   Marital status: Married    Spouse name: Not on file   Number of children: Not on file   Years of education: Not on file   Highest education level: Not on file  Occupational History   Not on file  Tobacco Use   Smoking status: Former    Types: Cigarettes   Smokeless tobacco: Never  Vaping Use   Vaping Use: Some days  Substance and Sexual Activity   Alcohol use: Not Currently   Drug use: No   Sexual activity: Yes  Other Topics Concern   Not on file  Social History Narrative   Not on file   Social Determinants of Health   Financial Resource Strain: Not on file  Food Insecurity: Not on file  Transportation Needs: Not on file  Physical Activity: Not on file  Stress: Not on file  Social Connections: Not on file  Intimate Partner Violence: Not on file     Current Outpatient Medications:    doxycycline (VIBRA-TABS) 100 MG tablet, Take 1 tablet (100 mg total) by mouth 2 (two) times daily for 10 days., Disp: 20 tablet, Rfl: 0   lisdexamfetamine (VYVANSE) 30 MG capsule, Take 1 capsule (30 mg total) by mouth daily., Disp: 30 capsule, Rfl: 0   lisdexamfetamine (VYVANSE) 30 MG capsule, Take 1 capsule (30 mg total) by mouth daily., Disp: 30 capsule, Rfl: 0   lisdexamfetamine (VYVANSE) 30 MG capsule, Take 1 capsule (30  mg total) by mouth daily., Disp: 30 capsule, Rfl: 0   TURMERIC PO, Take by mouth., Disp: , Rfl:    VITAMIN D PO, Take by mouth., Disp: , Rfl:   EXAM:  VITALS per patient if applicable: Ht 5\' 1"  (1.549 m)   Wt 145 lb (65.8 kg)   BMI 27.40 kg/m   GENERAL: alert, oriented, appears well and in no acute distress  HEENT: atraumatic, conjunttiva clear, no obvious abnormalities on inspection of external nose and ears  NECK: normal movements of the head and neck  LUNGS: on inspection no signs of respiratory distress, breathing rate appears normal, no obvious gross SOB, gasping or wheezing  CV: no obvious cyanosis  MS: moves all visible  extremities without noticeable abnormality  PSYCH/NEURO: pleasant and cooperative, no obvious depression or anxiety, speech and thought processing grossly intact  ASSESSMENT AND PLAN:  Discussed the following assessment and plan:  Acute maxillary sinusitis Prescribed course of doxycycline.  Encouraged continued supportive care with increase fluids and continuation of Mucinex and Allegra-D.Marland Kitchen  Contact clinic if symptoms or not improving or worsening.       I discussed the assessment and treatment plan with the patient. The patient was provided an opportunity to ask questions and all were answered. The patient agreed with the plan and demonstrated an understanding of the instructions.   The patient was advised to call back or seek an in-person evaluation if the symptoms worsen or if the condition fails to improve as anticipated.    Everrett Coombe, DO

## 2023-05-21 NOTE — Assessment & Plan Note (Addendum)
Prescribed course of doxycycline.  Encouraged continued supportive care with increase fluids and continuation of Mucinex and Allegra-D.Marland Kitchen  Contact clinic if symptoms or not improving or worsening.

## 2023-06-17 DIAGNOSIS — F4312 Post-traumatic stress disorder, chronic: Secondary | ICD-10-CM | POA: Diagnosis not present

## 2023-07-25 DIAGNOSIS — F4312 Post-traumatic stress disorder, chronic: Secondary | ICD-10-CM | POA: Diagnosis not present

## 2023-08-01 DIAGNOSIS — F4312 Post-traumatic stress disorder, chronic: Secondary | ICD-10-CM | POA: Diagnosis not present

## 2023-08-22 DIAGNOSIS — F4312 Post-traumatic stress disorder, chronic: Secondary | ICD-10-CM | POA: Diagnosis not present

## 2023-09-05 DIAGNOSIS — F4312 Post-traumatic stress disorder, chronic: Secondary | ICD-10-CM | POA: Diagnosis not present

## 2023-09-11 DIAGNOSIS — F4312 Post-traumatic stress disorder, chronic: Secondary | ICD-10-CM | POA: Diagnosis not present

## 2023-09-19 DIAGNOSIS — F4312 Post-traumatic stress disorder, chronic: Secondary | ICD-10-CM | POA: Diagnosis not present

## 2023-09-23 ENCOUNTER — Ambulatory Visit: Payer: BC Managed Care – PPO | Admitting: Physician Assistant

## 2023-09-23 ENCOUNTER — Encounter: Payer: Self-pay | Admitting: Physician Assistant

## 2023-09-23 VITALS — BP 122/83 | HR 94 | Ht 61.0 in | Wt 161.0 lb

## 2023-09-23 DIAGNOSIS — R233 Spontaneous ecchymoses: Secondary | ICD-10-CM | POA: Diagnosis not present

## 2023-09-23 DIAGNOSIS — R21 Rash and other nonspecific skin eruption: Secondary | ICD-10-CM | POA: Diagnosis not present

## 2023-09-23 DIAGNOSIS — T148XXA Other injury of unspecified body region, initial encounter: Secondary | ICD-10-CM | POA: Diagnosis not present

## 2023-09-23 DIAGNOSIS — L299 Pruritus, unspecified: Secondary | ICD-10-CM | POA: Diagnosis not present

## 2023-09-23 MED ORDER — METHYLPREDNISOLONE 4 MG PO TBPK: ORAL_TABLET | ORAL | 0 refills | Status: DC

## 2023-09-23 MED ORDER — METHYLPREDNISOLONE SODIUM SUCC 125 MG IJ SOLR
125.0000 mg | Freq: Once | INTRAMUSCULAR | Status: AC
  Administered 2023-09-23: 125 mg via INTRAMUSCULAR

## 2023-09-23 MED ORDER — HYDROXYZINE PAMOATE 25 MG PO CAPS: 25.0000 mg | ORAL_CAPSULE | Freq: Three times a day (TID) | ORAL | 0 refills | Status: DC | PRN

## 2023-09-23 NOTE — Progress Notes (Unsigned)
Acute Office Visit  Subjective:     Patient ID: Lauren Wade, female    DOB: Apr 11, 1981, 42 y.o.   MRN: [id number]  Chief Complaint  Patient presents with   Medical Management of Chronic Issues    Rash on legs     HPI Patient is in today for itchy rash on legs, arms, and back. Started mildly last Wednesday and this weekend suddenly worsened. At times it feels like "pens and needles on her legs". She has scratched so hard she is bleeding some places and others has bruising and petechia. She does not take any medications. She has not taking any new supplements. She hast not started using any new creams or lotions. There has been no change to her diet. She has never had anything like this before. She denies any fever, chills, body aches, nausea, vomiting. She has had a little more constipation lately and some mild abdominal waxing and waning discomfort. Nothing seems to make better or worse. She is putting OTC steroid creams with no benefit.    .. Active Ambulatory Problems    Diagnosis Date Noted   GAD (generalized anxiety disorder) 01/07/2015   Rectocele 02/11/2018   Epigastric pain 02/11/2018   Black stools 02/11/2018   Chronic idiopathic constipation 02/14/2018   Low TSH level 02/19/2018   Primary insomnia 03/17/2018   Chronic gastritis without bleeding 03/17/2018   Depressed mood 03/17/2018   Right kidney stone 07/17/2018   Binge eating disorder 08/25/2018   Restless leg syndrome 09/22/2018   Petechiae 09/22/2018   Routine physical examination 03/21/2020   Abnormal color of tongue 05/20/2020   Recurrent maxillary sinusitis 03/16/2022   Acute adjustment disorder with mixed anxiety and depressed mood 04/09/2022   Panic attacks 04/09/2022   Left-sided chest wall pain 10/22/2022   SOB (shortness of breath) 10/22/2022   Sternum pain 10/22/2022   Stress 01/24/2023   Acute maxillary sinusitis 05/21/2023   Pruritus 09/23/2023   Excoriated rash 09/24/2023   Bruising  09/24/2023   Resolved Ambulatory Problems    Diagnosis Date Noted   BRONCHITIS 01/21/2010   Cervical dysplasia 01/07/2015   Abnormal weight gain 01/07/2015   Class 2 obesity due to excess calories without serious comorbidity with body mass index (BMI) of 36.0 to 36.9 in adult 01/07/2015   Right shoulder pain 01/11/2015   RUQ pain 05/05/2015   Irritability and anger 07/17/2018   Mood changes 07/17/2018   Acute bronchitis 11/10/2018   Overweight (BMI 25.0-29.9) 01/16/2019   Ear pressure, right 05/20/2020   Past Medical History:  Diagnosis Date   COVID 07/05/2021   IBS (irritable bowel syndrome)       ROS See HPI.      Objective:    BP 122/83   Pulse 94   Ht 5\' 1"  (1.549 m)   Wt 161 lb (73 kg)   SpO2 100%   BMI 30.42 kg/m  BP Readings from Last 3 Encounters:  09/23/23 122/83  01/24/23 122/83  10/22/22 122/80   Wt Readings from Last 3 Encounters:  09/23/23 161 lb (73 kg)  05/21/23 145 lb (65.8 kg)  01/24/23 145 lb (65.8 kg)      Physical Exam Constitutional:      Appearance: Normal appearance.  HENT:     Head: Normocephalic.  Cardiovascular:     Rate and Rhythm: Normal rate and regular rhythm.     Pulses: Normal pulses.  Pulmonary:     Effort: Pulmonary effort is normal.  Skin:  Comments: Excoriated papular rash on bilateral arms, legs and back diffuse in scatted clusters.  Areas of brusing scattered over bilateral legs about size of dime to quarter.  A few patches of petechia over right hip and behind left knee.   Neurological:     General: No focal deficit present.     Mental Status: She is alert and oriented to person, place, and time.  Psychiatric:        Mood and Affect: Mood normal.          Assessment & Plan:  Marland KitchenMarland KitchenAmber was seen today for medical management of chronic issues.  Diagnoses and all orders for this visit:  Pruritus -     Sed Rate (ESR) -     CMP14+EGFR -     CBC w/Diff/Platelet -     C-reactive protein -     B12 and  Folate Panel -     Celiac Disease Panel -     hydrOXYzine (VISTARIL) 25 MG capsule; Take 1 capsule (25 mg total) by mouth every 8 (eight) hours as needed. -     methylPREDNISolone (MEDROL DOSEPAK) 4 MG TBPK tablet; Take as directed by package insert. -     TSH -     methylPREDNISolone sodium succinate (SOLU-MEDROL) 125 mg/2 mL injection 125 mg  Excoriated rash -     Sed Rate (ESR) -     CMP14+EGFR -     CBC w/Diff/Platelet -     C-reactive protein -     B12 and Folate Panel -     Celiac Disease Panel -     hydrOXYzine (VISTARIL) 25 MG capsule; Take 1 capsule (25 mg total) by mouth every 8 (eight) hours as needed. -     methylPREDNISolone (MEDROL DOSEPAK) 4 MG TBPK tablet; Take as directed by package insert. -     TSH -     methylPREDNISolone sodium succinate (SOLU-MEDROL) 125 mg/2 mL injection 125 mg  Petechiae -     CBC w/Diff/Platelet  Bruising -     CBC w/Diff/Platelet   Unclear etiology will get labs today to look for any metabolic reason for rash Solumedrol 125mg  IM given in office today Start medrol dose pack in the morning Likely petechia is from scratching so hard but will get cbc to look at platelets Hydroxyzine given as needed for itching Encouraged to use cool compresses over places where she itches the most   Return if symptoms worsen or fail to improve.  Tandy Gaw, PA-C

## 2023-09-23 NOTE — Patient Instructions (Signed)
Pruritus Pruritus is an itchy feeling on the skin. One of the most common causes is dry skin, but many different things can cause itching. Most cases of itching do not require medical attention. Sometimes itchy skin can turn into a rash or a secondary infection. Follow these instructions at home: Skin care  Do not use scented soaps, detergents, perfumes, and cosmetic products. Instead, use gentle, unscented versions of these items. Apply moisturizing creams to your skin frequently, at least twice daily. Apply immediately after bathing while skin is still wet. Take medicines or apply medicated creams only as told by your health care provider. This may include: Corticosteroid cream or topical calcineurin inhibitor. Anti-itch lotions containing urea, camphor, or menthol. Oral antihistamines. Do not take hot showers or baths, which can make itching worse. A short, cool shower may help with itching as long as you apply moisturizing lotion after the shower. Apply a cool, wet cloth (cool compress) to the affected areas. You may take lukewarm baths with one of the following: Epsom salts. You can get these at your local pharmacy or grocery store. Follow the instructions on the packaging. Baking soda. Pour a small amount into the bath as told by your health care provider. Colloidal oatmeal. You can get this at your local pharmacy or grocery store. Follow the instructions on the packaging. Do not scratch your skin. General instructions Avoid wearing tight clothes. Keep a journal to help find out what is causing your itching. Write down: What you eat and drink. What cosmetic products you use. What soaps or detergents you use. What you wear, including jewelry. Use a humidifier. This keeps the air moist, which helps to prevent dry skin. Be aware of any changes in your itchiness. Tell your health care provider about any changes. Contact a health care provider if: The itching does not go away after  several days. You notice redness, warmth, or drainage on the skin where you have scratched. You are unusually thirsty or urinating more than normal. Your skin tingles or feels numb. Your skin or the white parts of your eyes turn yellow (jaundice). You feel weak. You have any of the following: Night sweats. Tiredness (fatigue). Weight loss. Abdominal pain. Summary Pruritus is an itchy feeling on the skin. One of the most common causes is dry skin, but many different conditions and factors can cause itching. Apply moisturizing creams to your skin frequently, at least twice daily. Apply immediately after bathing while skin is still wet. Take medicines or apply medicated creams only as told by your health care provider. Do not take hot showers or baths. Do not use scented soaps, detergents, perfumes, or cosmetic products. Keep a journal to help find out what is causing your itching. This information is not intended to replace advice given to you by your health care provider. Make sure you discuss any questions you have with your health care provider. Document Revised: 12/27/2021 Document Reviewed: 12/27/2021 Elsevier Patient Education  2024 [employer].

## 2023-09-24 ENCOUNTER — Encounter: Payer: Self-pay | Admitting: Physician Assistant

## 2023-09-24 DIAGNOSIS — T148XXA Other injury of unspecified body region, initial encounter: Secondary | ICD-10-CM | POA: Insufficient documentation

## 2023-09-24 DIAGNOSIS — R21 Rash and other nonspecific skin eruption: Secondary | ICD-10-CM | POA: Insufficient documentation

## 2023-09-24 NOTE — Progress Notes (Signed)
Lauren Wade,   Normal hemoglobin and platelets.  Your Eosinophils are up meaning you came into something that created allergic reaction.  Inflammatory rate is normal.  Kidney, liver, glucose look good.  B12 and folate look GREAT.  TSH normal range and on low range of normal meaning you have plenty of thyroid hormone. (Not causing any weight gain)  Labs reassuring!

## 2023-09-24 NOTE — Progress Notes (Signed)
Negative for celiac

## 2023-09-25 LAB — CBC WITH DIFFERENTIAL/PLATELET
Basophils Absolute: 0.1 10*3/uL (ref 0.0–0.2)
Basos: 1 %
EOS (ABSOLUTE): 0.5 10*3/uL — ABNORMAL HIGH (ref 0.0–0.4)
Eos: 5 %
Hematocrit: 38.6 % (ref 34.0–46.6)
Hemoglobin: 12.6 g/dL (ref 11.1–15.9)
Immature Grans (Abs): 0 10*3/uL (ref 0.0–0.1)
Immature Granulocytes: 0 %
Lymphocytes Absolute: 3 10*3/uL (ref 0.7–3.1)
Lymphs: 29 %
MCH: 30.9 pg (ref 26.6–33.0)
MCHC: 32.6 g/dL (ref 31.5–35.7)
MCV: 95 fL (ref 79–97)
Monocytes Absolute: 0.7 10*3/uL (ref 0.1–0.9)
Monocytes: 6 %
Neutrophils Absolute: 6.2 10*3/uL (ref 1.4–7.0)
Neutrophils: 59 %
Platelets: 271 10*3/uL (ref 150–450)
RBC: 4.08 x10E6/uL (ref 3.77–5.28)
RDW: 11.7 % (ref 11.7–15.4)
WBC: 10.5 10*3/uL (ref 3.4–10.8)

## 2023-09-25 LAB — CMP14+EGFR
ALT: 8 [IU]/L (ref 0–32)
AST: 15 IU/L (ref 0–40)
Albumin: 4.1 g/dL (ref 3.9–4.9)
Alkaline Phosphatase: 69 IU/L (ref 44–121)
BUN/Creatinine Ratio: 20 (ref 9–23)
BUN: 15 mg/dL (ref 6–24)
Bilirubin Total: 0.2 mg/dL (ref 0.0–1.2)
CO2: 24 mmol/L (ref 20–29)
Calcium: 9.4 mg/dL (ref 8.7–10.2)
Chloride: 103 mmol/L (ref 96–106)
Creatinine, Ser: 0.74 mg/dL (ref 0.57–1.00)
Globulin, Total: 2.1 g/dL (ref 1.5–4.5)
Glucose: 76 mg/dL (ref 70–99)
Potassium: 4.1 mmol/L (ref 3.5–5.2)
Sodium: 139 mmol/L (ref 134–144)
Total Protein: 6.2 g/dL (ref 6.0–8.5)
eGFR: 104 mL/min/{1.73_m2} (ref >59)

## 2023-09-25 LAB — CELIAC DISEASE PANEL
Endomysial IgA: NEGATIVE
IgA/Immunoglobulin A, Serum: 159 mg/dL (ref 87–352)
Transglutaminase IgA: <2 U/mL (ref 0–3)

## 2023-09-25 LAB — TSH: TSH: 0.895 u[IU]/mL (ref 0.450–4.500)

## 2023-09-25 LAB — C-REACTIVE PROTEIN: CRP: <1 mg/L (ref 0–10)

## 2023-09-25 LAB — B12 AND FOLATE PANEL
Folate: 12.4 ng/mL (ref >3.0)
Vitamin B-12: 629 pg/mL (ref 232–1245)

## 2023-09-25 LAB — SEDIMENTATION RATE: Sed Rate: 5 mm/h (ref 0–32)

## 2023-09-30 ENCOUNTER — Encounter: Payer: Self-pay | Admitting: Physician Assistant

## 2023-09-30 DIAGNOSIS — L299 Pruritus, unspecified: Secondary | ICD-10-CM

## 2023-09-30 DIAGNOSIS — R233 Spontaneous ecchymoses: Secondary | ICD-10-CM

## 2023-09-30 DIAGNOSIS — R21 Rash and other nonspecific skin eruption: Secondary | ICD-10-CM

## 2023-09-30 DIAGNOSIS — T148XXA Other injury of unspecified body region, initial encounter: Secondary | ICD-10-CM

## 2023-10-03 DIAGNOSIS — L299 Pruritus, unspecified: Secondary | ICD-10-CM | POA: Diagnosis not present

## 2023-10-03 DIAGNOSIS — L300 Nummular dermatitis: Secondary | ICD-10-CM | POA: Diagnosis not present

## 2023-10-03 DIAGNOSIS — F4312 Post-traumatic stress disorder, chronic: Secondary | ICD-10-CM | POA: Diagnosis not present

## 2023-11-11 ENCOUNTER — Encounter: Payer: Self-pay | Admitting: Physician Assistant

## 2023-11-12 ENCOUNTER — Encounter: Payer: BC Managed Care – PPO | Admitting: Physician Assistant

## 2023-11-12 DIAGNOSIS — Z124 Encounter for screening for malignant neoplasm of cervix: Secondary | ICD-10-CM

## 2023-11-18 ENCOUNTER — Other Ambulatory Visit (HOSPITAL_COMMUNITY)
Admission: RE | Admit: 2023-11-18 | Discharge: 2023-11-18 | Disposition: A | Discharge status: Home / Self Care | Payer: BC Managed Care – PPO | Source: Ambulatory Visit | Attending: Physician Assistant | Admitting: Physician Assistant

## 2023-11-18 ENCOUNTER — Ambulatory Visit (INDEPENDENT_AMBULATORY_CARE_PROVIDER_SITE_OTHER): Payer: BC Managed Care – PPO | Admitting: Physician Assistant

## 2023-11-18 VITALS — BP 122/74 | HR 70 | Wt 163.0 lb

## 2023-11-18 DIAGNOSIS — Z0000 Encounter for general adult medical examination without abnormal findings: Secondary | ICD-10-CM | POA: Insufficient documentation

## 2023-11-18 DIAGNOSIS — Z124 Encounter for screening for malignant neoplasm of cervix: Secondary | ICD-10-CM

## 2023-11-18 DIAGNOSIS — R232 Flushing: Secondary | ICD-10-CM

## 2023-11-18 DIAGNOSIS — R300 Dysuria: Secondary | ICD-10-CM | POA: Diagnosis not present

## 2023-11-18 DIAGNOSIS — R635 Abnormal weight gain: Secondary | ICD-10-CM | POA: Diagnosis not present

## 2023-11-18 DIAGNOSIS — F50811 Binge eating disorder, moderate: Secondary | ICD-10-CM

## 2023-11-18 DIAGNOSIS — R3915 Urgency of urination: Secondary | ICD-10-CM | POA: Diagnosis not present

## 2023-11-18 DIAGNOSIS — F50819 Binge eating disorder, unspecified: Secondary | ICD-10-CM

## 2023-11-18 LAB — POCT URINALYSIS DIP (CLINITEK)
Bilirubin, UA: NEGATIVE
Blood, UA: NEGATIVE
Glucose, UA: NEGATIVE mg/dL
Ketones, POC UA: NEGATIVE mg/dL
Leukocytes, UA: NEGATIVE
Nitrite, UA: NEGATIVE
POC PROTEIN,UA: NEGATIVE
Spec Grav, UA: 1.01 (ref 1.010–1.025)
Urobilinogen, UA: 0.2 U/dL
pH, UA: 6.5 (ref 5.0–8.0)

## 2023-11-18 MED ORDER — LISDEXAMFETAMINE DIMESYLATE 30 MG PO CAPS
30.0000 mg | ORAL_CAPSULE | Freq: Every day | ORAL | 0 refills | Status: DC
Start: 2023-11-18 — End: 2024-03-23

## 2023-11-18 MED ORDER — LISDEXAMFETAMINE DIMESYLATE 30 MG PO CAPS: 30.0000 mg | ORAL_CAPSULE | Freq: Every day | ORAL | 0 refills | Status: DC

## 2023-11-18 MED ORDER — PHENAZOPYRIDINE HCL 200 MG PO TABS: 200.0000 mg | ORAL_TABLET | Freq: Three times a day (TID) | ORAL | 0 refills | Status: AC

## 2023-11-18 NOTE — Patient Instructions (Signed)

## 2023-11-19 ENCOUNTER — Encounter: Payer: Self-pay | Admitting: Physician Assistant

## 2023-11-19 DIAGNOSIS — R232 Flushing: Secondary | ICD-10-CM | POA: Insufficient documentation

## 2023-11-19 LAB — ESTRADIOL: Estradiol: 231 pg/mL

## 2023-11-19 LAB — TESTOSTERONE: Testosterone: 21 ng/dL (ref 4–50)

## 2023-11-19 LAB — FSH/LH
FSH: 6.8 m[IU]/mL
LH: 12.9 m[IU]/mL

## 2023-11-19 LAB — PROGESTERONE: Progesterone: 2.2 ng/mL

## 2023-11-19 NOTE — Progress Notes (Signed)
Complete physical exam  Patient: Lauren Wade   DOB: 1981/02/22   42 y.o. Female  MRN: [id number]  Subjective:    No chief complaint on file.   Lauren Wade is a 42 y.o. female who presents today for a complete physical exam. She reports consuming a  healthy(no sodas or sweets and 6 small meals a day)  diet.  She is active but no regular exercise.  She generally feels fairly well. She reports sleeping well. She does have additional problems to discuss today.   She would like to go back on vyvanse. She has gained weight and feels like she is "binging" more. She also felt better with her mood and focus on it.   She has had abnormal paps in past with hx of LEEP procedure. One sexual partner. No abnormal bleeding. She has had lower abdominal pressure and fullness for the last few days and even some discomfort when urinating. No fever, chills, body ache. Not tried anything to make better.   She is having hot flashes intermittently all day long. She wonders about her hormones.    Most recent fall risk assessment:    11/19/2023    7:00 AM  Fall Risk   Falls in the past year? 0  Number falls in past yr: 0  Injury with Fall? 0  Risk for fall due to : No Fall Risks  Follow up Falls evaluation completed     Most recent depression screenings:    11/19/2023    7:00 AM 01/24/2023    2:12 PM  PHQ 2/9 Scores  PHQ - 2 Score 1 4  PHQ- 9 Score 4 14    Vision:Within last year and Dental: No current dental problems and Receives regular dental care  Patient Active Problem List   Diagnosis Date Noted   Excoriated rash 09/24/2023   Bruising 09/24/2023   Pruritus 09/23/2023   Acute maxillary sinusitis 05/21/2023   Stress 01/24/2023   Left-sided chest wall pain 10/22/2022   SOB (shortness of breath) 10/22/2022   Sternum pain 10/22/2022   Acute adjustment disorder with mixed anxiety and depressed mood 04/09/2022   Panic attacks 04/09/2022   Recurrent maxillary sinusitis  03/16/2022   Abnormal color of tongue 05/20/2020   Routine physical examination 03/21/2020   Restless leg syndrome 09/22/2018   Petechiae 09/22/2018   Binge eating disorder 08/25/2018   Right kidney stone 07/17/2018   Primary insomnia 03/17/2018   Chronic gastritis without bleeding 03/17/2018   Depressed mood 03/17/2018   Low TSH level 02/19/2018   Chronic idiopathic constipation 02/14/2018   Rectocele 02/11/2018   Epigastric pain 02/11/2018   Black stools 02/11/2018   GAD (generalized anxiety disorder) 01/07/2015   Past Medical History:  Diagnosis Date   COVID 07/05/2021   IBS (irritable bowel syndrome)    Family History  Problem Relation Age of Onset   Depression Mother    Hyperlipidemia Father    Depression Paternal Aunt    Allergies  Allergen Reactions   Lexapro [Escitalopram Oxalate]     Not wanting to do anything and not caring about anything   Paxil [Paroxetine Hcl] Nausea And Vomiting   Zoloft [Sertraline Hcl] Other (See Comments)    Body shakes      Patient Care Team: Nolene Ebbs as PCP - General (Family Medicine)   Outpatient Medications Prior to Visit  Medication Sig   VITAMIN D PO Take by mouth.   [DISCONTINUED] hydrOXYzine (VISTARIL) 25 MG capsule Take  1 capsule (25 mg total) by mouth every 8 (eight) hours as needed.   [DISCONTINUED] methylPREDNISolone (MEDROL DOSEPAK) 4 MG TBPK tablet Take as directed by package insert.   [DISCONTINUED] TURMERIC PO Take by mouth.   No facility-administered medications prior to visit.    Review of Systems  All other systems reviewed and are negative.         Objective:     BP 122/74   Pulse 70   Wt 163 lb (73.9 kg)   SpO2 100%   BMI 30.80 kg/m  BP Readings from Last 3 Encounters:  11/18/23 122/74  09/23/23 122/83  01/24/23 122/83   Wt Readings from Last 3 Encounters:  11/18/23 163 lb (73.9 kg)  09/23/23 161 lb (73 kg)  05/21/23 145 lb (65.8 kg)      Physical Exam  BP 122/74    Pulse 70   Wt 163 lb (73.9 kg)   SpO2 100%   BMI 30.80 kg/m   General Appearance:    Alert, cooperative, no distress, appears stated age  Head:    Normocephalic, without obvious abnormality, atraumatic  Eyes:    PERRL, conjunctiva/corneas clear, EOM's intact, fundi    benign, both eyes  Ears:    Normal TM's and external ear canals, both ears  Nose:   Nares normal, septum midline, mucosa normal, no drainage    or sinus tenderness  Throat:   Lips, mucosa, and tongue normal; teeth and gums normal  Neck:   Supple, symmetrical, trachea midline, no adenopathy;    thyroid:  no enlargement/tenderness/nodules; no carotid   bruit or JVD  Back:     Symmetric, no curvature, ROM normal, no CVA tenderness  Lungs:     Clear to auscultation bilaterally, respirations unlabored  Chest Wall:    No tenderness or deformity   Heart:    Regular rate and rhythm, S1 and S2 normal, no murmur, rub   or gallop     Abdomen:     Soft, non-tender, bowel sounds active all four quadrants,    no masses, no organomegaly  Genitalia:    Normal female without lesion, discharge or tenderness cervical os friable  Rectal:    Visual inspection normal  Extremities:   Extremities normal, atraumatic, no cyanosis or edema  Pulses:   2+ and symmetric all extremities  Skin:   Skin color, texture, turgor normal, no rashes or lesions  Lymph nodes:   Cervical, supraclavicular, and axillary nodes normal  Neurologic:   CNII-XII intact, normal strength, sensation and reflexes    throughout   Results for orders placed or performed in visit on 11/18/23  POCT URINALYSIS DIP (CLINITEK)  Result Value Ref Range   Color, UA yellow yellow   Clarity, UA clear clear   Glucose, UA negative negative mg/dL   Bilirubin, UA negative negative   Ketones, POC UA negative negative mg/dL   Spec Grav, UA 1.610 9.604 - 1.025   Blood, UA negative negative   pH, UA 6.5 5.0 - 8.0   POC PROTEIN,UA negative negative, trace   Urobilinogen, UA 0.2 0.2 or  1.0 E.U./dL   Nitrite, UA Negative Negative   Leukocytes, UA Negative Negative       Assessment & Plan:    Routine Health Maintenance and Physical Exam  Immunization History  Administered Date(s) Administered   Influenza-Unspecified 08/15/2015, 09/02/2018   MMR 10/05/2014   Moderna Sars-Covid-2 Vaccination 04/04/2020, 05/01/2020   PPD Test 03/21/2020   Tdap 01/23/2019    Health  Maintenance  Topic Date Due   HIV Screening  Never done   Cervical Cancer Screening (HPV/Pap Cotest)  03/18/2023   COVID-19 Vaccine (3 - Moderna risk series) 12/04/2023 (Originally 05/29/2020)   INFLUENZA VACCINE  03/02/2024 (Originally 07/04/2023)   Hepatitis C Screening  09/22/2024 (Originally 01/24/2000)   MAMMOGRAM  05/12/2025   DTaP/Tdap/Td (2 - Td or Tdap) 01/23/2029   HPV VACCINES  Aged Out    Discussed health benefits of physical activity, and encouraged her to engage in regular exercise appropriate for her age and condition.  Marland Kitchen.Diagnoses and all orders for this visit:  Routine physical examination  Binge eating disorder -     lisdexamfetamine (VYVANSE) 30 MG capsule; Take 1 capsule (30 mg total) by mouth daily. -     lisdexamfetamine (VYVANSE) 30 MG capsule; Take 1 capsule (30 mg total) by mouth daily. -     lisdexamfetamine (VYVANSE) 30 MG capsule; Take 1 capsule (30 mg total) by mouth daily.  Urinary urgency -     phenazopyridine (PYRIDIUM) 200 MG tablet; Take 1 tablet (200 mg total) by mouth 3 (three) times daily for 2 days.  Dysuria -     phenazopyridine (PYRIDIUM) 200 MG tablet; Take 1 tablet (200 mg total) by mouth 3 (three) times daily for 2 days. -     Urine Culture -     POCT URINALYSIS DIP (CLINITEK)  Hot flashes -     FSH/LH -     Testosterone -     Estradiol -     Progesterone  Abnormal weight gain -     FSH/LH -     Testosterone -     Estradiol -     Progesterone  Routine Papanicolaou smear -     Cytology - PAP   .Marland Kitchen Discussed 150 minutes of exercise a week.   Encouraged vitamin D 1000 units and Calcium 1300mg  or 4 servings of dairy a day.  Vitals look great PHQ/GAD close to goal Pt frustrated with weight  Added back vyvanse for binge eating Pap done today Mammogram UTD Fasting labs UTD, hormone panel ordered today.  Discussed Veozah for hot flashes.    UA today was negative for any signs of infection Pyridium given to start Urine culture sent off. Increase hydration Follow up with any worsening symptoms      Tandy Gaw, PA-C

## 2023-11-19 NOTE — Progress Notes (Signed)
Your hormones are GREAT. No where close to menopausal range.

## 2023-11-21 ENCOUNTER — Encounter: Payer: Self-pay | Admitting: Family Medicine

## 2023-11-21 ENCOUNTER — Other Ambulatory Visit: Payer: Self-pay | Admitting: Family Medicine

## 2023-11-21 LAB — URINE CULTURE

## 2023-11-21 LAB — CYTOLOGY - PAP
Comment: NEGATIVE
Diagnosis: NEGATIVE
High risk HPV: NEGATIVE

## 2023-11-21 MED ORDER — SULFAMETHOXAZOLE-TRIMETHOPRIM 800-160 MG PO TABS
1.0000 | ORAL_TABLET | Freq: Two times a day (BID) | ORAL | 0 refills | Status: DC
Start: 2023-11-21 — End: 2023-12-12

## 2023-11-21 NOTE — Progress Notes (Signed)
Hi Sia, your urine culture was positive.  I am going to send over an antibiotic to get this cleared up if you are not better after the weekend then please let us know.

## 2023-11-29 ENCOUNTER — Other Ambulatory Visit: Payer: Self-pay | Admitting: Physician Assistant

## 2023-11-29 ENCOUNTER — Encounter: Payer: Self-pay | Admitting: Physician Assistant

## 2023-11-29 MED ORDER — SULFAMETHOXAZOLE-TRIMETHOPRIM 800-160 MG PO TABS: 1.0000 | ORAL_TABLET | Freq: Two times a day (BID) | ORAL | 0 refills | Status: DC

## 2023-11-29 NOTE — Telephone Encounter (Signed)
Copied from CRM [id number]. Topic: Clinical - Medication Refill >> Nov 29, 2023  1:22 PM Carlatta H wrote: Most Recent Primary Care Visit:  Provider: Jomarie Longs  Department: [facility]  Visit Type: PHYSICAL  Date: 11/18/2023  Medication: sulfamethoxazole-trimethoprim (BACTRIM DS) 800-160 MG tablet [[id number]]  Has the patient contacted their pharmacy? No (Agent: If no, request that the patient contact the pharmacy for the refill. If patient does not wish to contact the pharmacy document the reason why and proceed with request.) (Agent: If yes, when and what did the pharmacy advise?)  Is this the correct pharmacy for this prescription? Yes If no, delete pharmacy and type the correct one.  This is the patient's preferred pharmacy:  [facility] [address] [place] - [address] [place] [address] Phone: [phone] Fax: [phone]   Has the prescription been filled recently? Yes  Is the patient out of the medication? Yes  Has the patient been seen for an appointment in the last year OR does the patient have an upcoming appointment? Yes  Can we respond through MyChart? Yes  Agent: Please be advised that Rx refills may take up to 3 business days. We ask that you follow-up with your pharmacy.

## 2023-11-29 NOTE — Telephone Encounter (Unsigned)
Copied from CRM [id number]. Topic: Clinical - Medication Question >> Nov 29, 2023  1:24 PM Lauren Wade wrote: Reason for ION:[id number] was prescribe sulfamethoxazole-trimethoprim (BACTRIM DS) 800-160 MG tablet [[id number]] She is still having symptoms//I did submit for a refill as well//

## 2023-11-29 NOTE — Addendum Note (Signed)
Addended by: Jomarie Longs on: 11/29/2023 03:57 PM   Modules accepted: Orders

## 2023-12-12 ENCOUNTER — Ambulatory Visit: Payer: BC Managed Care – PPO | Admitting: Family Medicine

## 2023-12-12 ENCOUNTER — Encounter: Payer: Self-pay | Admitting: Family Medicine

## 2023-12-12 VITALS — BP 127/77 | HR 78 | Ht 61.0 in | Wt 163.0 lb

## 2023-12-12 DIAGNOSIS — R82998 Other abnormal findings in urine: Secondary | ICD-10-CM | POA: Diagnosis not present

## 2023-12-12 DIAGNOSIS — N390 Urinary tract infection, site not specified: Secondary | ICD-10-CM

## 2023-12-12 DIAGNOSIS — B9689 Other specified bacterial agents as the cause of diseases classified elsewhere: Secondary | ICD-10-CM

## 2023-12-12 DIAGNOSIS — R102 Pelvic and perineal pain: Secondary | ICD-10-CM | POA: Diagnosis not present

## 2023-12-12 LAB — POCT URINALYSIS DIP (CLINITEK)
Bilirubin, UA: NEGATIVE
Blood, UA: NEGATIVE
Glucose, UA: NEGATIVE mg/dL
Ketones, POC UA: NEGATIVE mg/dL
Leukocytes, UA: NEGATIVE
Nitrite, UA: NEGATIVE
POC PROTEIN,UA: NEGATIVE
Spec Grav, UA: <=1.005 — AB (ref 1.010–1.025)
Urobilinogen, UA: 0.2 U/dL
pH, UA: 6 (ref 5.0–8.0)

## 2023-12-12 NOTE — Addendum Note (Signed)
 Addended by: Deno Etienne on: 12/12/2023 02:01 PM   Modules accepted: Orders

## 2023-12-12 NOTE — Progress Notes (Signed)
   Acute Office Visit  Subjective:     Patient ID: Lauren Wade, female    DOB: May 24, 1981, 43 y.o.   MRN: [id number]  Chief Complaint  Patient presents with   Pelvic Pain   Abdominal Pain    HPI Patient is in today for pain during intercourse. Pelvic Burning ,pressure, and cramping that is constant.  She does have a history of uterine fibroids and does have heavy periods in fact she had 2 heavy periods in December and was passing clots.  She said that when she had intercourse the other day with her husband it was extremely painful and most made her nauseated.  She has not had any hematuria or dysuria.  She says she normally drinks plenty of water but has been drinking a little bit more coffee than usual.  Hx of constipation and rectocele.   Hx of cervical cryo age 41.  Recent pap normal.   ROS      Objective:    BP 127/77   Pulse 78   Ht 5' 1 (1.549 m)   Wt 163 lb (73.9 kg)   SpO2 100%   BMI 30.80 kg/m    Physical Exam  Results for orders placed or performed in visit on 12/12/23  POCT URINALYSIS DIP (CLINITEK)  Result Value Ref Range   Color, UA yellow yellow   Clarity, UA clear clear   Glucose, UA negative negative mg/dL   Bilirubin, UA negative negative   Ketones, POC UA negative negative mg/dL   Spec Grav, UA <=8.994 (A) 1.010 - 1.025   Blood, UA negative negative   pH, UA 6.0 5.0 - 8.0   POC PROTEIN,UA negative negative, trace   Urobilinogen, UA 0.2 0.2 or 1.0 E.U./dL   Nitrite, UA Negative Negative   Leukocytes, UA Negative Negative        Assessment & Plan:   Problem List Items Addressed This Visit   None Visit Diagnoses       Dark urine    -  Primary   Relevant Orders   POCT URINALYSIS DIP (CLINITEK) (Completed)   US  Pelvic Complete With Transvaginal     Pelvic pain       Relevant Orders   US  Pelvic Complete With Transvaginal     Pelvic cramping       Relevant Orders   US  Pelvic Complete With Transvaginal      Pelvic  pain-interestingly she did get relief both times that she was on antibiotics for UTI.  We will send her urine for repeat culture.  Will test for STDs.  And scheduled for pelvic ultrasound she does have a history of fibroids and heavy menses.  Will call the results once available.  Heavy period/menorrhagia-we discussed potential referral to OB/GYN.  She might be a great candidate for an ablation etc.  Will wait until we get ultrasound results back.  Her husband's had a vasectomy so she is not worried about pregnancy and is not interested in having anymore children.  No orders of the defined types were placed in this encounter.   No follow-ups on file.  Dorothyann Byars, MD

## 2023-12-13 ENCOUNTER — Ambulatory Visit (INDEPENDENT_AMBULATORY_CARE_PROVIDER_SITE_OTHER): Payer: BC Managed Care – PPO

## 2023-12-13 ENCOUNTER — Encounter: Payer: Self-pay | Admitting: Family Medicine

## 2023-12-13 DIAGNOSIS — R82998 Other abnormal findings in urine: Secondary | ICD-10-CM

## 2023-12-13 DIAGNOSIS — R102 Pelvic and perineal pain unspecified side: Secondary | ICD-10-CM

## 2023-12-13 DIAGNOSIS — N83201 Unspecified ovarian cyst, right side: Secondary | ICD-10-CM | POA: Diagnosis not present

## 2023-12-13 NOTE — Progress Notes (Signed)
 Hi Delina, ultrasound shows no acute abnormalities which is great.  You do have a possible small polyp lining the inside of the uterus.  As well as a small cyst on the right ovary measuring about 3 cm.  It looks like it be could be a hemorrhagic cyst these do usually resolve on their own which is great but definitely can cause some increase in pelvic pain they do recommend that we repeat the ultrasound in about 6 week to make sure it resolves.

## 2023-12-15 LAB — NUSWAB VAGINITIS PLUS (VG+)
Candida albicans, NAA: NEGATIVE
Candida glabrata, NAA: NEGATIVE

## 2023-12-16 ENCOUNTER — Encounter: Payer: Self-pay | Admitting: Family Medicine

## 2023-12-16 NOTE — Progress Notes (Signed)
 Hi Earle, the swab was negative for gonorrhea chlamydia, bacteria etc.  The urine culture looks like it is going to be positive but it still just the preliminary report so waiting for the final.

## 2023-12-17 ENCOUNTER — Encounter: Payer: Self-pay | Admitting: Family Medicine

## 2023-12-17 LAB — URINE CULTURE

## 2023-12-17 MED ORDER — SULFAMETHOXAZOLE-TRIMETHOPRIM 800-160 MG PO TABS: 1.0000 | ORAL_TABLET | Freq: Two times a day (BID) | ORAL | 0 refills | Status: DC

## 2023-12-17 MED ORDER — AMOXICILLIN-POT CLAVULANATE 875-125 MG PO TABS: 1.0000 | ORAL_TABLET | Freq: Two times a day (BID) | ORAL | 0 refills | Status: DC

## 2023-12-17 NOTE — Progress Notes (Signed)
 Based on the culture results it should technically treat this particular bacteria but I am happy to switch antibiotics to something different.  Will send over a new prescription for Augmentin .  I would recommend that she come back 1 week after completing her antibiotic to make sure that the infection is cleared.  I am not sure if it is the same infection and it is just not going away or if she is getting a new one each time.  So it would be helpful to make sure that we really have cleared it up.

## 2023-12-17 NOTE — Addendum Note (Signed)
 Addended by: Nani Gasser D on: 12/17/2023 11:49 AM   Modules accepted: Orders

## 2023-12-17 NOTE — Progress Notes (Signed)
 Hi Lauren Wade, your urine culture is positive for a bacteria called klebsiella. I should respond well to Bactrim DS. I will send over for a 5 days treatment.  Drink plenty of water to flush your urinary system.

## 2023-12-17 NOTE — Addendum Note (Signed)
 Addended by: Nani Gasser D on: 12/17/2023 01:59 PM   Modules accepted: Orders

## 2023-12-18 ENCOUNTER — Other Ambulatory Visit: Payer: Self-pay | Admitting: *Deleted

## 2023-12-18 DIAGNOSIS — N390 Urinary tract infection, site not specified: Secondary | ICD-10-CM

## 2024-02-13 ENCOUNTER — Telehealth: Admitting: Sports Medicine

## 2024-02-13 DIAGNOSIS — J0140 Acute pansinusitis, unspecified: Secondary | ICD-10-CM | POA: Diagnosis not present

## 2024-02-13 MED ORDER — AMOXICILLIN-POT CLAVULANATE 875-125 MG PO TABS: 1.0000 | ORAL_TABLET | Freq: Two times a day (BID) | ORAL | 0 refills | Status: DC

## 2024-02-13 MED ORDER — PREDNISONE 50 MG PO TABS: 50.0000 mg | ORAL_TABLET | Freq: Every day | ORAL | 0 refills | Status: DC

## 2024-02-13 MED ORDER — FLUCONAZOLE 150 MG PO TABS: 150.0000 mg | ORAL_TABLET | Freq: Once | ORAL | 3 refills | Status: AC

## 2024-02-13 NOTE — Progress Notes (Signed)
   Virtual Visit via WebEx/MyChart   I connected with  Lauren Wade  on 02/13/24 via WebEx/MyChart/Doximity Video and verified that I am speaking with the correct person using two identifiers.   I discussed the limitations, risks, security and privacy concerns of performing an evaluation and management service by WebEx/MyChart/Doximity Video, including the higher likelihood of inaccurate diagnosis and treatment, and the availability of in person appointments.  We also discussed the likely need of an additional face to face encounter for complete and high quality delivery of care.  I also discussed with the patient that there may be a patient responsible charge related to this service. The patient expressed understanding and wishes to proceed.  Provider location is in medical facility. Patient location is at their home, different from provider location. People involved in care of the patient during this telehealth encounter were myself, my nurse/medical assistant, and my front office/scheduling team member.  Review of Systems: No fevers, chills, night sweats, weight loss, chest pain, or shortness of breath.   Objective Findings:    General: Speaking full sentences, no audible heavy breathing.  Sounds alert and appropriately interactive.  Appears well.  Face symmetric.  Extraocular movements intact.  Pupils equal and round.  No nasal flaring or accessory muscle use visualized.  Independent interpretation of tests performed by another provider:   None.  Brief History, Exam, Impression, and Recommendations:    Acute pansinusitis Pleasant 43 year old female, she recalls symptoms of an upper respiratory infection with sore throat, she did a visit, strep swab and cultures were both negative, symptoms improved, then several days later she started to have increasing facial pain and pressure with pain radiating to the teeth, mild to moderate cough, no constitutional symptoms, no GI symptoms, no  shortness of breath. On video she is speaking full sentences with no nasal flaring or accessory muscle use. She does point to her frontal and maxillary sinuses as the location where she is having pain. Suspect that this was a double sickening of an acute sinusitis. We will add 10 days of Augmentin, Diflucan, 5 days of prednisone, return to see me if not better in a week or so.   I discussed the above assessment and treatment plan with the patient. The patient was provided an opportunity to ask questions and all were answered. The patient agreed with the plan and demonstrated an understanding of the instructions.   The patient was advised to call back or seek an in-person evaluation if the symptoms worsen or if the condition fails to improve as anticipated.   I provided 30 minutes of face to face and non-face-to-face time during this encounter date, time was needed to gather information, review chart, records, communicate/coordinate with staff remotely, as well as complete documentation.   ____________________________________________ Ihor Austin. Benjamin Stain, M.D., ABFM., CAQSM., AME. Primary Care and Sports Medicine [facility]  Adjunct Professor of Family Medicine  [employer]  Restaurant manager, fast food

## 2024-02-13 NOTE — Assessment & Plan Note (Signed)
 Pleasant 43 year old female, she recalls symptoms of an upper respiratory infection with sore throat, she did a visit, strep swab and cultures were both negative, symptoms improved, then several days later she started to have increasing facial pain and pressure with pain radiating to the teeth, mild to moderate cough, no constitutional symptoms, no GI symptoms, no shortness of breath. On video she is speaking full sentences with no nasal flaring or accessory muscle use. She does point to her frontal and maxillary sinuses as the location where she is having pain. Suspect that this was a double sickening of an acute sinusitis. We will add 10 days of Augmentin, Diflucan, 5 days of prednisone, return to see me if not better in a week or so.

## 2024-02-25 ENCOUNTER — Encounter (INDEPENDENT_AMBULATORY_CARE_PROVIDER_SITE_OTHER): Payer: Self-pay | Admitting: Sports Medicine

## 2024-02-25 DIAGNOSIS — J0140 Acute pansinusitis, unspecified: Secondary | ICD-10-CM

## 2024-02-25 MED ORDER — HYDROCOD POLI-CHLORPHE POLI ER 10-8 MG/5ML PO SUER: 5.0000 mL | Freq: Two times a day (BID) | ORAL | 0 refills | Status: DC | PRN

## 2024-02-25 NOTE — Telephone Encounter (Signed)

## 2024-03-23 ENCOUNTER — Ambulatory Visit: Admitting: Physician Assistant

## 2024-03-23 ENCOUNTER — Ambulatory Visit: Payer: Self-pay

## 2024-03-23 ENCOUNTER — Ambulatory Visit (INDEPENDENT_AMBULATORY_CARE_PROVIDER_SITE_OTHER)

## 2024-03-23 ENCOUNTER — Encounter: Payer: Self-pay | Admitting: Physician Assistant

## 2024-03-23 VITALS — BP 117/69 | HR 74 | Ht 61.0 in | Wt 151.0 lb

## 2024-03-23 DIAGNOSIS — W19XXXA Unspecified fall, initial encounter: Secondary | ICD-10-CM

## 2024-03-23 DIAGNOSIS — M25531 Pain in right wrist: Secondary | ICD-10-CM

## 2024-03-23 DIAGNOSIS — M25539 Pain in unspecified wrist: Secondary | ICD-10-CM | POA: Diagnosis not present

## 2024-03-23 DIAGNOSIS — S63091A Other subluxation of right wrist and hand, initial encounter: Secondary | ICD-10-CM | POA: Diagnosis not present

## 2024-03-23 DIAGNOSIS — F50811 Binge eating disorder, moderate: Secondary | ICD-10-CM | POA: Diagnosis not present

## 2024-03-23 MED ORDER — LISDEXAMFETAMINE DIMESYLATE 30 MG PO CAPS: 30.0000 mg | ORAL_CAPSULE | Freq: Every day | ORAL | 0 refills | Status: DC

## 2024-03-23 MED ORDER — IBUPROFEN 800 MG PO TABS: 800.0000 mg | ORAL_TABLET | Freq: Three times a day (TID) | ORAL | 0 refills | Status: DC | PRN

## 2024-03-23 NOTE — Telephone Encounter (Signed)
 Appt today with Sandy Crumb, PA

## 2024-03-23 NOTE — Patient Instructions (Signed)
 Ice at least twice a day Stay in brace for next 2 weeks Start exercises Ibuprofen  800mg  three times a day for next 5 days then as needed

## 2024-03-23 NOTE — Telephone Encounter (Signed)
 Copied from CRM [id number]. Topic: Clinical - Red Word Triage >> Mar 23, 2024  9:46 AM Lauren Wade wrote: [facility] that prompted transfer to Nurse Triage: Patient fell down stairs last week and fell on her right arm, wanted to give it time to see if it would get better and has had it wrapped up but says she thinks she needs to get it checked out. In a lot of pain, still swollen and has some bruising.  Chief Complaint: right arm injury from a fall one week ago Symptoms: pain, swelling, bruising Frequency: constant Pertinent Negatives: Patient denies numbness Disposition: [] ED /[] Urgent Care (no appt availability in office) / [x] Appointment(In office/virtual)/ []  [place] [facility]/ [] Home Care/ [] Refused Recommended Disposition /[] [facility]/ []  Follow-up with PCP Additional Notes: per protocol apt made for this afternoon.  Care advice given, denies questions; instructed to go to ER if becomes worse.   Reason for Disposition  Large swelling or bruise (> 2 inches or 5 cm)  Answer Assessment - Initial Assessment Questions 1. MECHANISM: "How did the injury happen?"     Rushing out the door to go to work and fell on right arm 2. ONSET: "When did the injury happen?" (Minutes or hours ago)      Last week 3. LOCATION: "Where is the injury located?" "Which arm?"     Right arm 4. APPEARANCE of INJURY: "What does the injury look like?"      Swollen, bruising  5. SEVERITY: "Can you use the arm normally?"      yes 6. SWELLING or BRUISING: "is there any swelling or bruising?" If Yes, ask: "How large is it? (e.g., inches, centimeters)      Bruising is better, swollen 7. PAIN: "Is there pain?" If Yes, ask: "How bad is the pain?"    (Scale 1-10; or mild, moderate, severe)   - NONE (0): No pain.   - MILD (1-3): Doesn't interfere with normal activities.   - MODERATE (4-7): Interferes with normal activities (e.g., work or school) or awakens from sleep.   - SEVERE (8-10): Excruciating pain,  unable to do any normal activities, unable to hold a cup of water.     5/10 8. TETANUS: For any breaks in the skin, ask: "When was the last tetanus booster?"     denies 9. OTHER SYMPTOMS: "Do you have any other symptoms?"  (e.g., numbness in hand)     denies 10. PREGNANCY: "Is there any chance you are pregnant?" "When was your last menstrual period?"       na  Protocols used: Arm Injury-A-AH

## 2024-03-24 ENCOUNTER — Encounter: Payer: Self-pay | Admitting: Physician Assistant

## 2024-03-24 DIAGNOSIS — S63091A Other subluxation of right wrist and hand, initial encounter: Secondary | ICD-10-CM | POA: Insufficient documentation

## 2024-03-24 DIAGNOSIS — M25539 Pain in unspecified wrist: Secondary | ICD-10-CM | POA: Insufficient documentation

## 2024-03-24 NOTE — Progress Notes (Signed)
No acute fracture. Treatment plan stays the same.

## 2024-03-24 NOTE — Progress Notes (Unsigned)
   Acute Office Visit  Subjective:     Patient ID: Lauren Wade, female    DOB: 04/17/1981, 43 y.o.   MRN: [id number]  Chief Complaint  Patient presents with   Arm Pain     Left arm pain onset since wednesday after a fall    HPI Patient is in today for right wrist and arm pain.   Pt needs refills of vyvanse  for binge eating. She feels like this if very helpful at controlling her impulses to eat. No side effects.       Objective:    BP 117/69   Pulse 74   Ht 5\' 1"  (1.549 m)   Wt 151 lb (68.5 kg)   SpO2 99%   BMI 28.53 kg/m  BP Readings from Last 3 Encounters:  03/23/24 117/69  12/12/23 127/77  11/18/23 122/74   Wt Readings from Last 3 Encounters:  03/23/24 151 lb (68.5 kg)  12/12/23 163 lb (73.9 kg)  11/18/23 163 lb (73.9 kg)      Physical Exam       Assessment & Plan:  Aaron AasAaron AasAmber was seen today for arm pain.  Diagnoses and all orders for this visit:  Subluxation of right extensor carpi ulnaris tendon, initial encounter -     ibuprofen  (ADVIL ) 800 MG tablet; Take 1 tablet (800 mg total) by mouth every 8 (eight) hours as needed.  Pain of ulnar side of wrist -     DG Wrist Complete Right; Future -     ibuprofen  (ADVIL ) 800 MG tablet; Take 1 tablet (800 mg total) by mouth every 8 (eight) hours as needed.  Moderate binge-eating disorder -     lisdexamfetamine (VYVANSE ) 30 MG capsule; Take 1 capsule (30 mg total) by mouth daily. -     lisdexamfetamine (VYVANSE ) 30 MG capsule; Take 1 capsule (30 mg total) by mouth daily. -     lisdexamfetamine (VYVANSE ) 30 MG capsule; Take 1 capsule (30 mg total) by mouth daily.      Aspen Deterding, PA-C

## 2024-03-25 ENCOUNTER — Encounter: Payer: Self-pay | Admitting: Physician Assistant

## 2024-05-07 DIAGNOSIS — F4312 Post-traumatic stress disorder, chronic: Secondary | ICD-10-CM | POA: Diagnosis not present

## 2024-05-28 DIAGNOSIS — F4312 Post-traumatic stress disorder, chronic: Secondary | ICD-10-CM | POA: Diagnosis not present

## 2024-06-24 ENCOUNTER — Ambulatory Visit: Admitting: Physician Assistant

## 2024-06-24 ENCOUNTER — Encounter: Payer: Self-pay | Admitting: Physician Assistant

## 2024-06-24 ENCOUNTER — Ambulatory Visit

## 2024-06-24 VITALS — BP 130/88 | HR 80 | Ht 61.0 in | Wt 162.0 lb

## 2024-06-24 DIAGNOSIS — N83201 Unspecified ovarian cyst, right side: Secondary | ICD-10-CM

## 2024-06-24 DIAGNOSIS — K602 Anal fissure, unspecified: Secondary | ICD-10-CM

## 2024-06-24 DIAGNOSIS — R140 Abdominal distension (gaseous): Secondary | ICD-10-CM | POA: Diagnosis not present

## 2024-06-24 DIAGNOSIS — K5904 Chronic idiopathic constipation: Secondary | ICD-10-CM

## 2024-06-24 DIAGNOSIS — R110 Nausea: Secondary | ICD-10-CM

## 2024-06-24 DIAGNOSIS — N838 Other noninflammatory disorders of ovary, fallopian tube and broad ligament: Secondary | ICD-10-CM | POA: Diagnosis not present

## 2024-06-24 DIAGNOSIS — R1084 Generalized abdominal pain: Secondary | ICD-10-CM | POA: Diagnosis not present

## 2024-06-24 DIAGNOSIS — F411 Generalized anxiety disorder: Secondary | ICD-10-CM

## 2024-06-24 DIAGNOSIS — N840 Polyp of corpus uteri: Secondary | ICD-10-CM | POA: Diagnosis not present

## 2024-06-24 DIAGNOSIS — R635 Abnormal weight gain: Secondary | ICD-10-CM

## 2024-06-24 DIAGNOSIS — N83202 Unspecified ovarian cyst, left side: Secondary | ICD-10-CM | POA: Diagnosis not present

## 2024-06-24 MED ORDER — LIDOCAINE 5 % EX OINT: 1.0000 | TOPICAL_OINTMENT | Freq: Three times a day (TID) | CUTANEOUS | 1 refills | Status: DC | PRN

## 2024-06-24 MED ORDER — LUBIPROSTONE 24 MCG PO CAPS: 24.0000 ug | ORAL_CAPSULE | Freq: Two times a day (BID) | ORAL | 2 refills | Status: DC

## 2024-06-24 NOTE — Patient Instructions (Addendum)
 Get pelvic u/s follow up Get abdominal xray Start amitiza  twice a day with meals Start ashwaganda twice a day 300mg   Start oregeno oil for digestion and consider adding pro-biotics Will get labs Will make referral to GI Lidocaine  as needed for anal fissures

## 2024-06-24 NOTE — Progress Notes (Signed)
 Acute Office Visit  Subjective:     Patient ID: Lauren Wade, female    DOB: [birth year], 43 y.o.   MRN: [id number]  Chief Complaint  Patient presents with   Abdominal Pain    HPI Patient is in today for lower abdominal pain for the last year that continues to get worse. She was found to have endometrial polyp and right ovarian cyst and was told to follow up in 6 months but she has not had follow up. She has chronic constipation that is worsening. She is drinking herbal tea and taking miralax  but stools go between diarrhea and constipation. She has pain with having stools and some bright red blood. She burps a lot but denies any reflux. She has a lot of gas. She seems to have more GI symptoms after she eats. She is nauseated intermittently. She is not vomiting.   ROS See HPI.      Objective:    BP 130/88   Pulse 80   Ht 5' 1 (1.549 m)   Wt 162 lb (73.5 kg)   SpO2 99%   BMI 30.61 kg/m  BP Readings from Last 3 Encounters:  06/24/24 130/88  03/23/24 117/69  12/12/23 127/77   Wt Readings from Last 3 Encounters:  06/24/24 162 lb (73.5 kg)  03/23/24 151 lb (68.5 kg)  12/12/23 163 lb (73.9 kg)      Physical Exam Constitutional:      Appearance: She is well-developed.  HENT:     Head: Normocephalic.  Abdominal:     General: Bowel sounds are normal. There is no distension.     Palpations: Abdomen is soft.     Tenderness: There is abdominal tenderness in the right lower quadrant and left lower quadrant. There is no right CVA tenderness, left CVA tenderness, guarding or rebound. Negative signs include Murphy's sign and Rovsing's sign.     Hernia: No hernia is present.  Neurological:     Mental Status: She is alert.          Assessment & Plan:  SABRASABRAAmber was seen today for abdominal pain.  Diagnoses and all orders for this visit:  Endometrial polyp -     US  Pelvic Complete With Transvaginal; Future  Cyst of right ovary -     US  Pelvic Complete With  Transvaginal; Future  Bloating -     DG Abd 1 View; Future -     TSH + free T4 -     CBC w/Diff/Platelet -     CMP14+EGFR -     Cancel: Allergens (22) Foods -     B12 and Folate Panel -     VITAMIN D  25 Hydroxy (Vit-D Deficiency, Fractures)  Chronic idiopathic constipation -     DG Abd 1 View; Future -     lubiprostone  (AMITIZA ) 24 MCG capsule; Take 1 capsule (24 mcg total) by mouth 2 (two) times daily with a meal. -     TSH + free T4 -     CBC w/Diff/Platelet -     CMP14+EGFR -     Cancel: Allergens (22) Foods -     B12 and Folate Panel -     VITAMIN D  25 Hydroxy (Vit-D Deficiency, Fractures)  Nausea  Abnormal weight gain -     IgG Allergens (22) Foods  Anal fissure -     lidocaine  (XYLOCAINE ) 5 % ointment; Apply 1 Application topically 3 (three) times daily as needed. For anal fissure pain.  GAD (  generalized anxiety disorder)   Unclear etiology of symptoms Some of bloating and abdominal pain could be constipation related Start amitiza  bid Will get xray of adomen  U/S ordered to follow up for hx of endometrial polyp/ovarian cyst Cmp/cbc/lipase/food IGG ordered today Lidocaine  for anal fissures and continue to work on softening stool Will make refill to GI Consider oregeno oil for digestion Ashwaganda for stress and anxiety   Vermell Bologna, PA-C

## 2024-06-25 LAB — CBC WITH DIFFERENTIAL/PLATELET
Basophils Absolute: 0 x10E3/uL (ref 0.0–0.2)
Basos: 1 %
EOS (ABSOLUTE): 0.2 x10E3/uL (ref 0.0–0.4)
Eos: 2 %
Hematocrit: 42.6 % (ref 34.0–46.6)
Hemoglobin: 14 g/dL (ref 11.1–15.9)
Immature Grans (Abs): 0 x10E3/uL (ref 0.0–0.1)
Immature Granulocytes: 0 %
Lymphocytes Absolute: 2.7 x10E3/uL (ref 0.7–3.1)
Lymphs: 33 %
MCH: 30.7 pg (ref 26.6–33.0)
MCHC: 32.9 g/dL (ref 31.5–35.7)
MCV: 93 fL (ref 79–97)
Monocytes Absolute: 0.5 x10E3/uL (ref 0.1–0.9)
Monocytes: 6 %
Neutrophils Absolute: 4.6 x10E3/uL (ref 1.4–7.0)
Neutrophils: 58 %
Platelets: 263 x10E3/uL (ref 150–450)
RBC: 4.56 x10E6/uL (ref 3.77–5.28)
RDW: 12.3 % (ref 11.7–15.4)
WBC: 8 x10E3/uL (ref 3.4–10.8)

## 2024-06-25 LAB — CMP14+EGFR
ALT: 9 IU/L (ref 0–32)
AST: 13 IU/L (ref 0–40)
Albumin: 4.1 g/dL (ref 3.9–4.9)
Alkaline Phosphatase: 67 IU/L (ref 44–121)
BUN/Creatinine Ratio: 15 (ref 9–23)
BUN: 11 mg/dL (ref 6–24)
Bilirubin Total: 0.3 mg/dL (ref 0.0–1.2)
CO2: 19 mmol/L — ABNORMAL LOW (ref 20–29)
Calcium: 9.2 mg/dL (ref 8.7–10.2)
Chloride: 102 mmol/L (ref 96–106)
Creatinine, Ser: 0.72 mg/dL (ref 0.57–1.00)
Globulin, Total: 2.5 g/dL (ref 1.5–4.5)
Glucose: 90 mg/dL (ref 70–99)
Potassium: 3.9 mmol/L (ref 3.5–5.2)
Sodium: 138 mmol/L (ref 134–144)
Total Protein: 6.6 g/dL (ref 6.0–8.5)
eGFR: 107 mL/min/1.73 (ref >59)

## 2024-06-25 LAB — B12 AND FOLATE PANEL
Folate: 7.7 ng/mL (ref >3.0)
Vitamin B-12: 864 pg/mL (ref 232–1245)

## 2024-06-25 LAB — TSH+FREE T4
Free T4: 1.22 ng/dL (ref 0.82–1.77)
TSH: 0.684 u[IU]/mL (ref 0.450–4.500)

## 2024-06-25 LAB — VITAMIN D 25 HYDROXY (VIT D DEFICIENCY, FRACTURES): Vit D, 25-Hydroxy: 32.7 ng/mL (ref 30.0–100.0)

## 2024-06-26 ENCOUNTER — Ambulatory Visit: Payer: Self-pay | Admitting: Physician Assistant

## 2024-06-26 ENCOUNTER — Ambulatory Visit

## 2024-06-26 DIAGNOSIS — R935 Abnormal findings on diagnostic imaging of other abdominal regions, including retroperitoneum: Secondary | ICD-10-CM

## 2024-06-26 DIAGNOSIS — R1084 Generalized abdominal pain: Secondary | ICD-10-CM | POA: Diagnosis not present

## 2024-06-26 DIAGNOSIS — N200 Calculus of kidney: Secondary | ICD-10-CM | POA: Diagnosis not present

## 2024-06-26 DIAGNOSIS — K838 Other specified diseases of biliary tract: Secondary | ICD-10-CM | POA: Diagnosis not present

## 2024-06-26 DIAGNOSIS — K7689 Other specified diseases of liver: Secondary | ICD-10-CM | POA: Diagnosis not present

## 2024-06-26 LAB — IGG ALLERGENS (22) FOODS
Casein IgG: 2.1 ug/mL — ABNORMAL HIGH (ref 0.0–1.9)
Cheese, Mold Type IgG: 12.1 ug/mL — ABNORMAL HIGH (ref 0.0–1.9)
Chicken IgG: <2 ug/mL (ref 0.0–1.9)
Chocolate/Cacao IgG: <2 ug/mL (ref 0.0–1.9)
Coffee IgG: <2 ug/mL (ref 0.0–1.9)
Corn IgG: <2 ug/mL (ref 0.0–1.9)
Egg, Whole IgG: 13.4 ug/mL — ABNORMAL HIGH (ref 0.0–1.9)
F218-IgG Paprika/Sweet Pepper: 6.1 ug/mL — ABNORMAL HIGH (ref 0.0–1.9)
Green Bean IgG: <2 ug/mL (ref 0.0–1.9)
Haddock IgG: <2 ug/mL (ref 0.0–1.9)
Lamb IgG: <2 ug/mL (ref 0.0–1.9)
Oat IgG: 3.4 ug/mL — ABNORMAL HIGH (ref 0.0–1.9)
Onion IgG: 2.3 ug/mL — ABNORMAL HIGH (ref 0.0–1.9)
Peanut IgG: <2 ug/mL (ref 0.0–1.9)
Pork IgG: <2 ug/mL (ref 0.0–1.9)
Potato, White, IgG: <2 ug/mL (ref 0.0–1.9)
Rye IgG: 4 ug/mL — ABNORMAL HIGH (ref 0.0–1.9)
Shrimp IgG: <2 ug/mL (ref 0.0–1.9)
Soybean IgG: <2 ug/mL (ref 0.0–1.9)
Tomato IgG: 2.9 ug/mL — ABNORMAL HIGH (ref 0.0–1.9)
Wheat IgG: 5.1 ug/mL — ABNORMAL HIGH (ref 0.0–1.9)
Yeast IgG: <2 ug/mL (ref 0.0–1.9)

## 2024-06-26 MED ORDER — IOHEXOL 300 MG/ML  SOLN
100.0000 mL | Freq: Once | INTRAMUSCULAR | Status: AC | PRN
  Administered 2024-06-26: 100 mL via INTRAVENOUS

## 2024-06-26 NOTE — Progress Notes (Signed)
 Lauren Wade, no acute findings on CT. Non obstructing stone in the kidneys. There is some sludge in the gallbladder and a cyst in the left lobe of liver.

## 2024-06-26 NOTE — Progress Notes (Signed)
 Lauren Wade,   Vitamin D  normal range but I would increase vitamin D3 by 1000 units daily.  B12 looks great.  WBC looks good. No sign of infection.  Thyroid  looks good.  Kidney and liver function looks good.   You do have some IGG food sensitives with Egg and cheese being pretty high and are in a lot of food. Consider elimination for the next 2 weeks and see if symptoms improve. I did order STAT CT to get a look at what could be causing the trapped gas.

## 2024-06-26 NOTE — Progress Notes (Signed)
 I would like to get CT scan to better evaluate the increase in small bowel gas. Will order.

## 2024-06-29 ENCOUNTER — Encounter: Payer: Self-pay | Admitting: Physician Assistant

## 2024-06-30 NOTE — Telephone Encounter (Signed)
 I think jaded put a GI referral in for her they may want to do a follow-up exam of her gallbladder called a HIDA scan but we will leave that up to GI.  If she does not hear from them by the end of the week then please let us  know.

## 2024-07-01 NOTE — Progress Notes (Signed)
 Lauren Wade, I am covering for Brush while she is out I just wanted you to let you know your ultrasound results.  The cyst that they saw on your right ovary has gone away on its own.  There is a little cyst on the left ovary which they think is on its way to going away so they did not recommend any other further follow-up and then the polyp that they saw is still measuring 5 mm so no changes.

## 2024-07-07 DIAGNOSIS — Z131 Encounter for screening for diabetes mellitus: Secondary | ICD-10-CM | POA: Diagnosis not present

## 2024-07-07 DIAGNOSIS — E611 Iron deficiency: Secondary | ICD-10-CM | POA: Diagnosis not present

## 2024-07-07 DIAGNOSIS — R5382 Chronic fatigue, unspecified: Secondary | ICD-10-CM | POA: Diagnosis not present

## 2024-07-07 DIAGNOSIS — Z1329 Encounter for screening for other suspected endocrine disorder: Secondary | ICD-10-CM | POA: Diagnosis not present

## 2024-07-07 DIAGNOSIS — N926 Irregular menstruation, unspecified: Secondary | ICD-10-CM | POA: Diagnosis not present

## 2024-07-08 DIAGNOSIS — F4312 Post-traumatic stress disorder, chronic: Secondary | ICD-10-CM | POA: Diagnosis not present

## 2024-07-24 DIAGNOSIS — F4312 Post-traumatic stress disorder, chronic: Secondary | ICD-10-CM | POA: Diagnosis not present

## 2024-07-29 DIAGNOSIS — R5382 Chronic fatigue, unspecified: Secondary | ICD-10-CM | POA: Diagnosis not present

## 2024-07-29 DIAGNOSIS — N926 Irregular menstruation, unspecified: Secondary | ICD-10-CM | POA: Diagnosis not present

## 2024-07-29 DIAGNOSIS — L659 Nonscarring hair loss, unspecified: Secondary | ICD-10-CM | POA: Diagnosis not present

## 2024-07-29 DIAGNOSIS — K581 Irritable bowel syndrome with constipation: Secondary | ICD-10-CM | POA: Diagnosis not present

## 2024-08-04 ENCOUNTER — Encounter: Payer: Self-pay | Admitting: Sports Medicine

## 2024-08-12 DIAGNOSIS — L659 Nonscarring hair loss, unspecified: Secondary | ICD-10-CM | POA: Diagnosis not present

## 2024-08-12 DIAGNOSIS — N926 Irregular menstruation, unspecified: Secondary | ICD-10-CM | POA: Diagnosis not present

## 2024-08-12 DIAGNOSIS — K581 Irritable bowel syndrome with constipation: Secondary | ICD-10-CM | POA: Diagnosis not present

## 2024-08-12 DIAGNOSIS — R5382 Chronic fatigue, unspecified: Secondary | ICD-10-CM | POA: Diagnosis not present

## 2024-08-24 DIAGNOSIS — F4312 Post-traumatic stress disorder, chronic: Secondary | ICD-10-CM | POA: Diagnosis not present

## 2024-09-21 DIAGNOSIS — M25551 Pain in right hip: Secondary | ICD-10-CM | POA: Diagnosis not present

## 2024-09-21 DIAGNOSIS — M9905 Segmental and somatic dysfunction of pelvic region: Secondary | ICD-10-CM | POA: Diagnosis not present

## 2024-09-21 DIAGNOSIS — M461 Sacroiliitis, not elsewhere classified: Secondary | ICD-10-CM | POA: Diagnosis not present

## 2024-09-21 DIAGNOSIS — M9902 Segmental and somatic dysfunction of thoracic region: Secondary | ICD-10-CM | POA: Diagnosis not present

## 2024-09-21 DIAGNOSIS — M9904 Segmental and somatic dysfunction of sacral region: Secondary | ICD-10-CM | POA: Diagnosis not present

## 2024-11-19 DIAGNOSIS — Z1231 Encounter for screening mammogram for malignant neoplasm of breast: Secondary | ICD-10-CM | POA: Diagnosis not present

## 2024-11-19 DIAGNOSIS — R92333 Mammographic heterogeneous density, bilateral breasts: Secondary | ICD-10-CM | POA: Diagnosis not present

## 2024-11-19 LAB — HM MAMMOGRAPHY

## 2025-01-05 NOTE — Progress Notes (Unsigned)
" ° °  GYNECOLOGY OFFICE VISIT NOTE  History:  Lauren Wade is a 44 y.o. No obstetric history on file. here today for endomerial polyp.   She had an US  which showed right ovarian cyst but f/u showed resolution.     Past Medical History:  Diagnosis Date   COVID 07/05/2021   IBS (irritable bowel syndrome)     Past Surgical History:  Procedure Laterality Date   ANKLE SURGERY Right 10/08/2020   removal of cervical CA      The following portions of the patient's history were reviewed and updated as appropriate: allergies, current medications, past family history, past medical history, past social history, past surgical history and problem list.   Health Maintenance:   Normal pap and negative HRHPV: Diagnosis  Date Value Ref Range Status  11/18/2023   Final   - Negative for intraepithelial lesion or malignancy (NILM)    Review of Systems:  Pertinent items noted in HPI and remainder of comprehensive ROS otherwise negative.  Physical Exam:  There were no vitals taken for this visit. CONSTITUTIONAL: Well-developed, well-nourished female in no acute distress.  HEENT:  Normocephalic, atraumatic. External right and left ear normal. No scleral icterus.  NECK: Normal range of motion, supple, no masses noted on observation SKIN: No rash noted. Not diaphoretic. No erythema. No pallor. MUSCULOSKELETAL: Normal range of motion. No edema noted. NEUROLOGIC: Alert and oriented to person, place, and time. Normal muscle tone coordination. No cranial nerve deficit noted. PSYCHIATRIC: Normal mood and affect. Normal behavior. Normal judgment and thought content.  Labs and Imaging No results found for this or any previous visit (from the past week). No results found.  Assessment and Plan:  1. Endometrial polyp (Primary) Discussed can be done in the office or in the OR.  Reviewed pros/cons.  Patient prefers *** Discussed risks of procedure: ***    Diagnoses and all orders for this  visit:  Endometrial polyp     No orders of the defined types were placed in this encounter.    Routine preventative health maintenance measures emphasized. Please refer to After Visit Summary for other counseling recommendations.   No follow-ups on file.  Vina Solian, MD, FACOG Obstetrician & Gynecologist, [facility] for [facility], [facility]      "

## 2025-01-07 ENCOUNTER — Ambulatory Visit: Admitting: Obstetrics and Gynecology

## 2025-01-07 ENCOUNTER — Encounter: Payer: Self-pay | Admitting: Physician Assistant

## 2025-01-07 ENCOUNTER — Encounter: Payer: Self-pay | Admitting: Obstetrics and Gynecology

## 2025-01-07 VITALS — BP 134/76 | HR 92 | Ht 62.0 in | Wt 170.0 lb

## 2025-01-07 DIAGNOSIS — N939 Abnormal uterine and vaginal bleeding, unspecified: Secondary | ICD-10-CM

## 2025-01-07 DIAGNOSIS — N840 Polyp of corpus uteri: Secondary | ICD-10-CM

## 2025-01-07 NOTE — Telephone Encounter (Signed)
 Hello Lauren Wade,   The patient states never getting a call for the GI specialist for appointment scheduling. Can you follow up on the following request? Thanks in advance.

## 2025-01-28 ENCOUNTER — Other Ambulatory Visit: Admitting: Obstetrics and Gynecology
